# Patient Record
Sex: Male | Born: 1960 | Race: Black or African American | Hispanic: No | Marital: Single | Smoking: Never smoker
Health system: Southern US, Community
[De-identification: ages and names within clinical notes are randomized; demographics above are authoritative.]

## PROBLEM LIST (undated history)

## (undated) DIAGNOSIS — M21372 Foot drop, left foot: Secondary | ICD-10-CM

## (undated) DIAGNOSIS — R269 Unspecified abnormalities of gait and mobility: Secondary | ICD-10-CM

## (undated) DIAGNOSIS — D649 Anemia, unspecified: Secondary | ICD-10-CM

## (undated) DIAGNOSIS — E785 Hyperlipidemia, unspecified: Secondary | ICD-10-CM

## (undated) DIAGNOSIS — I69354 Hemiplegia and hemiparesis following cerebral infarction affecting left non-dominant side: Principal | ICD-10-CM

## (undated) DIAGNOSIS — N400 Enlarged prostate without lower urinary tract symptoms: Secondary | ICD-10-CM

## (undated) DIAGNOSIS — I10 Essential (primary) hypertension: Secondary | ICD-10-CM

## (undated) DIAGNOSIS — R569 Unspecified convulsions: Secondary | ICD-10-CM

## (undated) DIAGNOSIS — F329 Major depressive disorder, single episode, unspecified: Secondary | ICD-10-CM

## (undated) DIAGNOSIS — I639 Cerebral infarction, unspecified: Secondary | ICD-10-CM

## (undated) DIAGNOSIS — F0390 Unspecified dementia without behavioral disturbance: Secondary | ICD-10-CM

## (undated) DIAGNOSIS — K219 Gastro-esophageal reflux disease without esophagitis: Secondary | ICD-10-CM

## (undated) DIAGNOSIS — Z982 Presence of cerebrospinal fluid drainage device: Secondary | ICD-10-CM

## (undated) DIAGNOSIS — N189 Chronic kidney disease, unspecified: Secondary | ICD-10-CM

## (undated) HISTORY — PX: OTHER SURGICAL HISTORY: SHX169

## (undated) HISTORY — PX: CAROTID ENDARTERECTOMY: SUR193

## (undated) HISTORY — DX: Hyperlipidemia, unspecified: E78.5

## (undated) HISTORY — DX: Unspecified convulsions: R56.9

## (undated) HISTORY — DX: Unspecified abnormalities of gait and mobility: R26.9

## (undated) HISTORY — DX: Major depressive disorder, single episode, unspecified: F32.9

## (undated) HISTORY — DX: Anemia, unspecified: D64.9

## (undated) HISTORY — DX: Cerebral infarction, unspecified: I63.9

## (undated) HISTORY — DX: Hemiplegia and hemiparesis following cerebral infarction affecting left non-dominant side: I69.354

## (undated) HISTORY — DX: Presence of cerebrospinal fluid drainage device: Z98.2

## (undated) HISTORY — DX: Chronic kidney disease, unspecified: N18.9

## (undated) HISTORY — DX: Unspecified dementia without behavioral disturbance: F03.90

## (undated) HISTORY — DX: Gastro-esophageal reflux disease without esophagitis: K21.9

## (undated) HISTORY — DX: Foot drop, left foot: M21.372

## (undated) HISTORY — DX: Benign prostatic hyperplasia without lower urinary tract symptoms: N40.0

## (undated) NOTE — *Deleted (*Deleted)
COVID Vaccine Completed:Yes Date COVID Vaccine completed:11/28/19 COVID vaccine manufacturer: Pfizer     PCP - Valere Dross FNP Cardiologist -none   Chest x-ray - no EKG - 07/09/20 Stress Test - no ECHO - no Cardiac Cath - no Pacemaker/ICD device last checked:NA  Sleep Study - no CPAP -   Fasting Blood Sugar - NA Checks Blood Sugar _____ times a day  Blood Thinner Instructions:NA Aspirin Instructions: Last Dose:  Anesthesia review:   Patient denies shortness of breath, fever, cough and chest pain at PAT appointment  yes   Patient verbalized understanding of instructions that were given to them at the PAT appointment. Patient was also instructed that they will need to review over the PAT instructions again at home before surgery. Yes  Pt lives at [facility] assisted living. He uses [facility] and will need a rapid Covid test done DOS. He has a motorized wheelchair because of weakness on Lt side

---

## 2008-09-15 DIAGNOSIS — Z982 Presence of cerebrospinal fluid drainage device: Secondary | ICD-10-CM

## 2008-09-15 DIAGNOSIS — I639 Cerebral infarction, unspecified: Secondary | ICD-10-CM

## 2008-09-15 HISTORY — DX: Presence of cerebrospinal fluid drainage device: Z98.2

## 2008-09-15 HISTORY — DX: Cerebral infarction, unspecified: I63.9

## 2014-04-07 ENCOUNTER — Telehealth: Payer: Self-pay | Admitting: *Deleted

## 2014-04-07 NOTE — Telephone Encounter (Signed)
Appointment scheduled.

## 2014-04-10 ENCOUNTER — Encounter: Payer: Self-pay | Admitting: *Deleted

## 2014-04-10 ENCOUNTER — Encounter: Payer: Self-pay | Admitting: Neurology

## 2014-04-18 ENCOUNTER — Encounter: Payer: Self-pay | Admitting: Neurology

## 2014-04-18 ENCOUNTER — Ambulatory Visit (INDEPENDENT_AMBULATORY_CARE_PROVIDER_SITE_OTHER): Payer: Medicaid Other | Admitting: Neurology

## 2014-04-18 VITALS — BP 76/52 | HR 46 | Ht 71.0 in | Wt 200.0 lb

## 2014-04-18 DIAGNOSIS — I69959 Hemiplegia and hemiparesis following unspecified cerebrovascular disease affecting unspecified side: Secondary | ICD-10-CM

## 2014-04-18 DIAGNOSIS — I69354 Hemiplegia and hemiparesis following cerebral infarction affecting left non-dominant side: Secondary | ICD-10-CM

## 2014-04-18 DIAGNOSIS — R269 Unspecified abnormalities of gait and mobility: Secondary | ICD-10-CM | POA: Insufficient documentation

## 2014-04-18 HISTORY — DX: Unspecified abnormalities of gait and mobility: R26.9

## 2014-04-18 HISTORY — DX: Hemiplegia and hemiparesis following cerebral infarction affecting left non-dominant side: I69.354

## 2014-04-18 MED ORDER — BACLOFEN 10 MG PO TABS: ORAL_TABLET | ORAL | Status: DC

## 2014-04-18 NOTE — Progress Notes (Signed)
Reason for visit: Left hemiparesis  Wesley [name] is an 53 y.o. 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 The patient was last seen through this office on 05/05/2012. At that time, he was been placed on baclofen for his left-sided spasticity. The patient has apparently stopped walking on a regular basis. He was using a hemiwalker for ambulation. He has been reduced on the baclofen, even though he indicates that previously he was tolerating the medication well. He now is on 10 mg twice daily of baclofen. The patient has significant stiffness and weakness of the left side the body. The patient denies any recent falls. He is basically standing for transfers, and using a power wheelchair for mobility. The patient is motivated to try to get back to walking better. The patient returns to this office for an evaluation.  Past Medical History  Diagnosis Date  . Brainstem stroke     right  . Ventricular shunt in place   . Seizures   . GERD (gastroesophageal reflux disease)   . Depression   . Chronic renal insufficiency   . Dyslipidemia   . BPH (benign prostatic hyperplasia)   . Gait disorder   . Hemiparesis affecting left side as late effect of cerebrovascular accident 04/18/2014  . Abnormality of gait 04/18/2014    Past Surgical History  Procedure Laterality Date  . Carotid endarterectomy      Family History  Problem Relation Age of Onset  . Diabetes Mother     died of dm complication  . Brain cancer Father     brain tumor  . Alcoholism Sister     Social history:  reports that he has never smoked. He has never used smokeless tobacco. He reports that he does not drink alcohol or use illicit drugs.   No Known Allergies  Medications:  Current Outpatient Prescriptions on File Prior to Visit  Medication Sig Dispense Refill  . acetaminophen  (TYLENOL) 325 MG tablet Take 650 mg by mouth every 4 (four) hours as needed.      . levETIRAcetam (KEPPRA) 1000 MG tablet Take 1,000 mg by mouth 2 (two) times daily.      . Multiple Vitamin (MULTIVITAMIN) tablet Take 1 tablet by mouth daily.      . simvastatin (ZOCOR) 10 MG tablet Take 10 mg by mouth daily.      . tamsulosin (FLOMAX) 0.4 MG CAPS capsule Take 0.4 mg by mouth at bedtime.       No current facility-administered medications on file prior to visit.    ROS:  Out of a complete 14 system review of symptoms, the patient complains only of the following symptoms, and all other reviewed systems are negative.  Walking difficulty, muscle stiffness  Blood pressure 76/52, pulse 46, height 5\' 11"  (1.803 m), weight 200 lb (90.719 kg).  Physical Exam  General: The patient is alert and cooperative at the time of the examination.  Skin: No significant peripheral edema is noted.   Neurologic Exam  Mental status: The patient is oriented x 3.  Cranial nerves: Facial symmetry is present. Speech is normal, no aphasia or dysarthria is noted. Extraocular movements are full. Visual fields are full.  Motor: The patient has good strength in the right extremities. On the left side, there is increased motor tone on the left arm and left leg. The left arm is  in flexion, with minimal grip, flexion or extension of elbow, or ability to abduct the arm. With the leg on the left, there is increased motor tone, the patient is able to extend the knee, but then cannot bend the knee.  Sensory examination: Soft touch sensation is decreased on the left arm and left leg as compared to the right. Soft touch sensation on the face is symmetric.  Coordination: The patient has good finger-nose-finger and heel-to-shin on the right, unable to perform on the left..  Gait and station: The patient is able to stand up with some assistance. Once up, he demonstrates a prominent circumduction gait with the left leg, can walk  with assistance.  Reflexes: Deep tendon reflexes are slightly increased on the left arm and leg.   Assessment/Plan:  1. Right brain intracranial hemorrhage, left hemiparesis  2. Gait disorder  The patient has significant spasticity on the left side of the body. The patient will be increased on the baclofen taking 10 mg 3 times daily for 2 weeks, then take 20 mg the morning, and 10 mg at midday and evening. He will followup in 3-4 months. We will get physical therapy to start getting him up and try to ambulate him. The patient will need medication adjustments, and if the oral medications are not effective, a baclofen pump may be indicated in the future.  Marlan Palau. Keith Renelle Stegenga MD 04/18/2014 8:38 PM  [facility] [address] [place], [place] [address]  Phone [phone] Fax [phone]

## 2014-04-18 NOTE — Patient Instructions (Signed)

## 2014-04-25 ENCOUNTER — Ambulatory Visit: Payer: Medicaid Other | Attending: Neurology

## 2014-04-25 DIAGNOSIS — R279 Unspecified lack of coordination: Secondary | ICD-10-CM | POA: Insufficient documentation

## 2014-04-25 DIAGNOSIS — M629 Disorder of muscle, unspecified: Secondary | ICD-10-CM | POA: Diagnosis not present

## 2014-04-25 DIAGNOSIS — M2420 Disorder of ligament, unspecified site: Secondary | ICD-10-CM | POA: Insufficient documentation

## 2014-04-25 DIAGNOSIS — IMO0001 Reserved for inherently not codable concepts without codable children: Secondary | ICD-10-CM | POA: Insufficient documentation

## 2014-04-25 DIAGNOSIS — R262 Difficulty in walking, not elsewhere classified: Secondary | ICD-10-CM | POA: Insufficient documentation

## 2014-07-25 ENCOUNTER — Ambulatory Visit: Payer: Medicaid Other | Admitting: Neurology

## 2014-08-18 ENCOUNTER — Ambulatory Visit: Payer: Medicaid Other | Admitting: Adult Health

## 2014-08-21 ENCOUNTER — Encounter: Payer: Self-pay | Admitting: Neurology

## 2014-08-21 ENCOUNTER — Ambulatory Visit (INDEPENDENT_AMBULATORY_CARE_PROVIDER_SITE_OTHER): Payer: Medicaid Other | Admitting: Neurology

## 2014-08-21 VITALS — BP 89/64 | HR 47

## 2014-08-21 DIAGNOSIS — R269 Unspecified abnormalities of gait and mobility: Secondary | ICD-10-CM

## 2014-08-21 DIAGNOSIS — I69354 Hemiplegia and hemiparesis following cerebral infarction affecting left non-dominant side: Secondary | ICD-10-CM

## 2014-08-21 DIAGNOSIS — M21372 Foot drop, left foot: Secondary | ICD-10-CM

## 2014-08-21 DIAGNOSIS — I69854 Hemiplegia and hemiparesis following other cerebrovascular disease affecting left non-dominant side: Secondary | ICD-10-CM

## 2014-08-21 HISTORY — DX: Foot drop, left foot: M21.372

## 2014-08-21 NOTE — Patient Instructions (Addendum)
Baclofen dose of 20 mg in the morning and 10 mg at midday and in the evening  Fall Prevention and Home Safety Falls cause injuries and can affect all age groups. It is possible to use preventive measures to significantly decrease the likelihood of falls. There are many simple measures which can make your home safer and prevent falls. OUTDOORS  Repair cracks and edges of walkways and driveways.  Remove high doorway thresholds.  Trim shrubbery on the main path into your home.  Have good outside lighting.  Clear walkways of tools, rocks, debris, and clutter.  Check that handrails are not broken and are securely fastened. Both sides of steps should have handrails.  Have leaves, snow, and ice cleared regularly.  Use sand or salt on walkways during winter months.  In the garage, clean up grease or oil spills. BATHROOM  Install night lights.  Install grab bars by the toilet and in the tub and shower.  Use non-skid mats or decals in the tub or shower.  Place a plastic non-slip stool in the shower to sit on, if needed.  Keep floors dry and clean up all water on the floor immediately.  Remove soap buildup in the tub or shower on a regular basis.  Secure bath mats with non-slip, double-sided rug tape.  Remove throw rugs and tripping hazards from the floors. BEDROOMS  Install night lights.  Make sure a bedside light is easy to reach.  Do not use oversized bedding.  Keep a telephone by your bedside.  Have a firm chair with side arms to use for getting dressed.  Remove throw rugs and tripping hazards from the floor. KITCHEN  Keep handles on pots and pans turned toward the center of the stove. Use back burners when possible.  Clean up spills quickly and allow time for drying.  Avoid walking on wet floors.  Avoid hot utensils and knives.  Position shelves so they are not too high or low.  Place commonly used objects within easy reach.  If necessary, use a sturdy  step stool with a grab bar when reaching.  Keep electrical cables out of the way.  Do not use floor polish or wax that makes floors slippery. If you must use wax, use non-skid floor wax.  Remove throw rugs and tripping hazards from the floor. STAIRWAYS  Never leave objects on stairs.  Place handrails on both sides of stairways and use them. Fix any loose handrails. Make sure handrails on both sides of the stairways are as long as the stairs.  Check carpeting to make sure it is firmly attached along stairs. Make repairs to worn or loose carpet promptly.  Avoid placing throw rugs at the top or bottom of stairways, or properly secure the rug with carpet tape to prevent slippage. Get rid of throw rugs, if possible.  Have an electrician put in a light switch at the top and bottom of the stairs. OTHER FALL PREVENTION TIPS  Wear low-heel or rubber-soled shoes that are supportive and fit well. Wear closed toe shoes.  When using a stepladder, make sure it is fully opened and both spreaders are firmly locked. Do not climb a closed stepladder.  Add color or contrast paint or tape to grab bars and handrails in your home. Place contrasting color strips on first and last steps.  Learn and use mobility aids as needed. Install an electrical emergency response system.  Turn on lights to avoid dark areas. Replace light bulbs that burn out immediately.  Get light switches that glow.  Arrange furniture to create clear pathways. Keep furniture in the same place.  Firmly attach carpet with non-skid or double-sided tape.  Eliminate uneven floor surfaces.  Select a carpet pattern that does not visually hide the edge of steps.  Be aware of all pets. OTHER HOME SAFETY TIPS  Set the water temperature for 120 F (48.8 C).  Keep emergency numbers on or near the telephone.  Keep smoke detectors on every level of the home and near sleeping areas. Document Released: 08/22/2002 Document Revised:  03/02/2012 Document Reviewed: 11/21/2011 [facility] Patient Information 2015 [place]. This information is not intended to replace advice given to you by your health care provider. Make sure you discuss any questions you have with your health care provider.

## 2014-08-21 NOTE — Progress Notes (Signed)
Reason for visit: Hemiparesis  Wesley RollingDavid L [name] is an 53 y.o. male  History of present illness:  Wesley [name] is a [age] right-handed black male with a history of cerebrovascular disease sustaining an intracranial hemorrhage on the right brain with a left hemiparesis. The patient has arm greater than left leg weakness. The patient has had spasticity of the left side, and his baclofen dose was increased when he was last seen. The patient is not sure that he is getting the baclofen 3 times daily, he believes that he is getting it twice a day. The patient has not been walking much with a hemiwalker, but he has a significant left sided footdrop as well. The patient does fall on occasion, but not frequently. He returns to this office for further evaluation. He indicates that he is tolerating the medications fairly well.  Past Medical History  Diagnosis Date  . Brainstem stroke     right  . Ventricular shunt in place   . Seizures   . GERD (gastroesophageal reflux disease)   . Depression   . Chronic renal insufficiency   . Dyslipidemia   . BPH (benign prostatic hyperplasia)   . Gait disorder   . Hemiparesis affecting left side as late effect of cerebrovascular accident 04/18/2014  . Abnormality of gait 04/18/2014  . Foot drop, left 08/21/2014    Past Surgical History  Procedure Laterality Date  . Carotid endarterectomy    . Umilical hernia repair    . Athroscopic surgery knee Left     Family History  Problem Relation Age of Onset  . Diabetes Mother     died of dm complication  . Brain cancer Father     brain tumor  . Alcoholism Sister     Social history:  reports that he has never smoked. He has never used smokeless tobacco. He reports that he does not drink alcohol or use illicit drugs.   No Known Allergies  Medications:  Current Outpatient Prescriptions on File Prior to Visit  Medication Sig Dispense Refill  . acetaminophen (TYLENOL) 325 MG tablet Take 650 mg by mouth every 4  (four) hours as needed.    Marland Kitchen. amLODipine (NORVASC) 10 MG tablet Take 10 mg by mouth daily.    . baclofen (LIORESAL) 10 MG tablet One tablet three times a day for 2 weeks, then take 2 tablets in the morning and one tablet at midday and evening 120 each 3  . Cholecalciferol (VITAMIN D-3) 5000 UNITS TABS Take by mouth daily.    Marland Kitchen. guaifenesin (ROBITUSSIN) 100 MG/5ML syrup Take 200 mg by mouth 3 (three) times daily as needed for cough.    . levETIRAcetam (KEPPRA) 1000 MG tablet Take 1,000 mg by mouth 2 (two) times daily.    Marland Kitchen. loperamide (IMODIUM) 2 MG capsule Take by mouth as needed for diarrhea or loose stools.    . magnesium hydroxide (MILK OF MAGNESIA) 400 MG/5ML suspension Take by mouth daily as needed for mild constipation.    . metoprolol succinate (TOPROL-XL) 50 MG 24 hr tablet Take 50 mg by mouth 2 (two) times daily. Take with or immediately following a meal. Take one and one half tablets two times a day.    . Multiple Vitamin (MULTIVITAMIN) tablet Take 1 tablet by mouth daily.    Marland Kitchen. neomycin-bacitracin-polymyxin (NEOSPORIN) [phone] ointment Apply topically as needed.    . Polyethylene Glycol 3350 GRAN by Does not apply route.    . selenium sulfide (ANTI-DANDRUFF) 1 % LOTN  Apply 1 application topically daily.    . simvastatin (ZOCOR) 10 MG tablet Take 10 mg by mouth daily.    . tamsulosin (FLOMAX) 0.4 MG CAPS capsule Take 0.4 mg by mouth. Take two tabs in morning.     No current facility-administered medications on file prior to visit.    ROS:  Out of a complete 14 system review of symptoms, the patient complains only of the following symptoms, and all other reviewed systems are negative.  Left-sided weakness Gait disturbance  Blood pressure 89/64, pulse 47, height 0' (0 m), weight 0 lb (0 kg).  Physical Exam  General: The patient is alert and cooperative at the time of the examination.  Skin: No significant peripheral edema is noted.   Neurologic Exam  Mental status: The  patient is oriented x 3.  Cranial nerves: Facial symmetry is not present. The left nasolabial fold is depressed. Speech is normal, no aphasia or dysarthria is noted. Extraocular movements are full. Visual fields are full.  Motor: The patient has good strength in all 4 extremities.  Sensory examination: Soft touch sensation is symmetric on the face, arms, but decreased on the left face  Coordination: The patient has good finger-nose-finger and heel-to-shin on the right, the patient is able to perform heel-to-shin on the left leg with some difficulty.  Gait and station: The gait was not tested. The patient is wheelchair-bound.  Reflexes: Deep tendon reflexes are symmetric.   Assessment/Plan:  1. Right brain intracranial hemorrhage, left hemiparesis  2. Gait disorder  The patient will remain on the baclofen at the current dose taking 20 mg the morning, 10 mg at noon and in the evening. The patient indicates that he does not wish to consider a baclofen pump in the future. He will be given up her prescription for AFO brace for the left foot, and he will be set up for physical therapy evaluation. He will follow-up in 6 months.   Marlan Palau. Keith Willis MD 08/21/2014 7:59 PM  [facility] [address] [place], [place] [address]  Phone [phone] Fax [phone]

## 2015-02-20 ENCOUNTER — Ambulatory Visit (INDEPENDENT_AMBULATORY_CARE_PROVIDER_SITE_OTHER): Payer: Medicaid Other | Admitting: Neurology

## 2015-02-20 ENCOUNTER — Encounter: Payer: Self-pay | Admitting: Neurology

## 2015-02-20 VITALS — BP 87/66 | HR 50 | Ht 71.0 in | Wt 224.0 lb

## 2015-02-20 DIAGNOSIS — M21372 Foot drop, left foot: Secondary | ICD-10-CM

## 2015-02-20 DIAGNOSIS — R269 Unspecified abnormalities of gait and mobility: Secondary | ICD-10-CM | POA: Diagnosis not present

## 2015-02-20 DIAGNOSIS — I69354 Hemiplegia and hemiparesis following cerebral infarction affecting left non-dominant side: Secondary | ICD-10-CM

## 2015-02-20 DIAGNOSIS — I69854 Hemiplegia and hemiparesis following other cerebrovascular disease affecting left non-dominant side: Secondary | ICD-10-CM

## 2015-02-20 MED ORDER — TIZANIDINE HCL 2 MG PO TABS: 2.0000 mg | ORAL_TABLET | Freq: Four times a day (QID) | ORAL | Status: DC | PRN

## 2015-02-20 MED ORDER — BACLOFEN 20 MG PO TABS: 20.0000 mg | ORAL_TABLET | Freq: Three times a day (TID) | ORAL | Status: AC

## 2015-02-20 NOTE — Patient Instructions (Signed)
Spasticity Spasticity is a condition in which certain muscles contract continuously. This causes stiffness or tightness of the muscles. It may interfere with movement, speech, and manner of walking. CAUSES  This condition is usually caused by damage to the portion of the brain or spinal cord that controls voluntary movement. It may occur in association with:  Spinal cord injury.  Multiple sclerosis.  Cerebral palsy.  Brain damage due to lack of oxygen.  Brain trauma.  Severe head injury.  Metabolic diseases such as:  Adrenoleukodystrophy.  ALS (Lou Gehrig's disease).  Phenylketonuria. SYMPTOMS   Increased muscle tone (hypertonicity).  A series of rapid muscle contractions (clonus).  Exaggerated deep tendon reflexes.  Muscle spasms.  Involuntary crossing of the legs (scissoring).  Fixed joints. The degree of spasticity varies. It ranges from mild muscle stiffness to severe, painful, and uncontrollable muscle spasms. It can interfere with rehabilitation in patients with certain disorders. It often interferes with daily activities. TREATMENT  Treatment may include:  Medications.  Physical therapy regimens. They may include muscle stretching and range of motion exercises. These help prevent shrinkage or shortening of muscles. They also help reduce the severity of symptoms.  Surgery. This may be recommended for tendon release or to sever the nerve-muscle pathway. PROGNOSIS  The outcome for those with spasticity depends on:  Severity of the spasticity.  Associated disorder(s). Document Released: 08/22/2002 Document Revised: 11/24/2011 Document Reviewed: 11/15/2013 Exit[employer] Patient Information 2015 [employer], LLC. This information is not intended to replace advice given to you by your health care provider. Make sure you discuss any questions you have with your health care provider.  

## 2015-02-20 NOTE — Progress Notes (Signed)
Reason for visit: Left hemiparesis  Wesley [name] is an 54 y.o. male  History of present illness:  Mr. Sahagian is a 54 year old right-handed black male with a history of a prior right brain intracranial hemorrhage and left sided hemiparesis that has been persistent. The patient is essentially not ambulatory. The patient will walk short distances with a hemiwalker. He denies any falls. He still has a lot of spasticity involving the left leg. He is on baclofen taking 20 mg the morning and 10 mg twice during the day. The patient denies any painful spasms of the arm or the left leg. The patient denies any other new medical issues that have come up since last seen. He returns for further evaluation.  Past Medical History  Diagnosis Date  . Brainstem stroke     right  . Ventricular shunt in place   . Seizures   . GERD (gastroesophageal reflux disease)   . Depression   . Chronic renal insufficiency   . Dyslipidemia   . BPH (benign prostatic hyperplasia)   . Gait disorder   . Hemiparesis affecting left side as late effect of cerebrovascular accident 04/18/2014  . Abnormality of gait 04/18/2014  . Foot drop, left 08/21/2014    Past Surgical History  Procedure Laterality Date  . Carotid endarterectomy    . Umilical hernia repair    . Athroscopic surgery knee Left     Family History  Problem Relation Age of Onset  . Diabetes Mother     died of dm complication  . Brain cancer Father     brain tumor  . Alcoholism Sister     Social history:  reports that he has never smoked. He has never used smokeless tobacco. He reports that he does not drink alcohol or use illicit drugs.   No Known Allergies  Medications:  Prior to Admission medications   Medication Sig Start Date End Date Taking? Authorizing Provider  acetaminophen (TYLENOL) 325 MG tablet Take 650 mg by mouth every 4 (four) hours as needed.   Yes Historical Provider, MD  AFLURIA PRESERVATIVE FREE 0.5 ML SUSY  07/07/14  Yes  Historical Provider, MD  amLODipine (NORVASC) 10 MG tablet Take 10 mg by mouth daily.   Yes Historical Provider, MD  baclofen (LIORESAL) 10 MG tablet One tablet three times a day for 2 weeks, then take 2 tablets in the morning and one tablet at midday and evening 04/18/14  Yes York Spaniel, MD  Cholecalciferol (VITAMIN D-3) 5000 UNITS TABS Take by mouth daily.   Yes Historical Provider, MD  guaifenesin (ROBITUSSIN) 100 MG/5ML syrup Take 200 mg by mouth 3 (three) times daily as needed for cough.   Yes Historical Provider, MD  levETIRAcetam (KEPPRA) 1000 MG tablet Take 1,000 mg by mouth 2 (two) times daily.   Yes Historical Provider, MD  loperamide (IMODIUM) 2 MG capsule Take by mouth as needed for diarrhea or loose stools.   Yes Historical Provider, MD  magnesium hydroxide (MILK OF MAGNESIA) 400 MG/5ML suspension Take by mouth daily as needed for mild constipation.   Yes Historical Provider, MD  metoprolol succinate (TOPROL-XL) 50 MG 24 hr tablet Take 50 mg by mouth 2 (two) times daily. Take with or immediately following a meal. Take one and one half tablets two times a day.   Yes Historical Provider, MD  Multiple Vitamin (MULTIVITAMIN) tablet Take 1 tablet by mouth daily.   Yes Historical Provider, MD  neomycin-bacitracin-polymyxin (NEOSPORIN) [phone] ointment Apply topically  as needed.   Yes Historical Provider, MD  Polyethylene Glycol 3350 GRAN by Does not apply route.   Yes Historical Provider, MD  selenium sulfide (ANTI-DANDRUFF) 1 % LOTN Apply 1 application topically daily.   Yes Historical Provider, MD  simvastatin (ZOCOR) 10 MG tablet Take 10 mg by mouth daily.   Yes Historical Provider, MD  tamsulosin (FLOMAX) 0.4 MG CAPS capsule Take 0.4 mg by mouth. Take two tabs in morning.   Yes Historical Provider, MD  temazepam (RESTORIL) 30 MG capsule Take 30 mg by mouth at bedtime as needed for sleep.   Yes Historical Provider, MD  traZODone (DESYREL) 100 MG tablet Take 100 mg by mouth at  bedtime.   Yes Historical Provider, MD    ROS:  Out of a complete 14 system review of symptoms, the patient complains only of the following symptoms, and all other reviewed systems are negative.  Gait disorder  Blood pressure 87/66, pulse 50, height 5\' 11"  (1.803 m), weight 224 lb (101.606 kg).  Physical Exam  General: The patient is alert and cooperative at the time of the examination.  Skin: No significant peripheral edema is noted.   Neurologic Exam  Mental status: The patient is alert and oriented x 3 at the time of the examination. The patient has apparent normal recent and remote memory, with an apparently normal attention span and concentration ability.   Cranial nerves: Facial symmetry is not present. The patient has some depression of the left nasolabial fold. Speech is normal, no aphasia or dysarthria is noted. Extraocular movements are full. Visual fields are full.  Motor: The patient has good strength in the right extremities. The left upper extremity is near plegic, patient has some grip with the left hand. The left leg has 3/5 strength, significant increase in motor tone noted.  Sensory examination: Soft touch sensation is symmetric on the face, arms, and legs.  Coordination: The patient has good finger-nose-finger and heel-to-shin on the right, not able to perform on the left.  Gait and station: The patient is wheelchair-bound, not ambulated.  Reflexes: Deep tendon reflexes are symmetric.   Assessment/Plan:  1. Left hemiparesis  2. Gait disorder  The patient has significant spasticity involving the left leg. The patient will go up on the baclofen taking 20 mg 3 times daily. The patient will be given tizanidine to take 2 mg if he is going to be making transfers, or traveling. This is a time when the spasticity significantly worsens. He will follow-up in 6 months.  Marlan Palau. Keith Willis MD 02/20/2015 7:44 PM  [facility] [address] [place], [place] [address]  Phone [phone] Fax [phone]

## 2015-08-21 ENCOUNTER — Ambulatory Visit (INDEPENDENT_AMBULATORY_CARE_PROVIDER_SITE_OTHER): Payer: Medicaid Other | Admitting: Neurology

## 2015-08-21 ENCOUNTER — Encounter: Payer: Self-pay | Admitting: Neurology

## 2015-08-21 VITALS — BP 114/74 | HR 58 | Resp 16 | Ht 71.0 in | Wt 224.0 lb

## 2015-08-21 DIAGNOSIS — I69354 Hemiplegia and hemiparesis following cerebral infarction affecting left non-dominant side: Secondary | ICD-10-CM | POA: Diagnosis not present

## 2015-08-21 DIAGNOSIS — M21372 Foot drop, left foot: Secondary | ICD-10-CM | POA: Diagnosis not present

## 2015-08-21 DIAGNOSIS — R269 Unspecified abnormalities of gait and mobility: Secondary | ICD-10-CM | POA: Diagnosis not present

## 2015-08-21 NOTE — Progress Notes (Signed)
Reason for visit: Left hemiparesis  Wesley [name] is an 54 y.o. 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 The patient is minimally ambulatory, he can walk short distances with a hemiwalker, but he indicates that in reality he is not doing much walking. He has had some issues with spasticity of the left side, the baclofen dose was increased taking 20 mg 3 times daily on his last visit. He indicates that he is no longer having painful spasms of the left arm and left leg. He was placed on tizanidine to take if needed, but he has not been on the medication. He has gone back to school trying to get his GED. Overall, he believes that he is doing relatively well. He does not sleep well at night often times, but he is not very active during the day. He will nap some during the day. He comes to this office for an evaluation. He denies any falls, no other new medical issues have come up since last seen.  Past Medical History  Diagnosis Date  . Brainstem stroke (HCC)     right  . Ventricular shunt in place   . Seizures (HCC)   . GERD (gastroesophageal reflux disease)   . Depression   . Chronic renal insufficiency   . Dyslipidemia   . BPH (benign prostatic hyperplasia)   . Gait disorder   . 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 Abnormality of gait 04/18/2014  . Foot drop, left 08/21/2014    Past Surgical History  Procedure Laterality Date  . Carotid endarterectomy    . Umilical hernia repair    . Athroscopic surgery knee Left     Family History  Problem Relation Age of Onset  . Diabetes Mother     died of dm complication  . Brain cancer Father     brain tumor  . Alcoholism Sister     Social history:  reports that he has never smoked. He has never used smokeless tobacco. He reports that he does  not drink alcohol or use illicit drugs.   No Known Allergies  Medications:  Prior to Admission medications   Medication Sig Start Date End Date Taking? Authorizing Provider  AFLURIA PRESERVATIVE FREE 0.5 ML SUSY  07/07/14  Yes Historical Provider, MD  amLODipine (NORVASC) 10 MG tablet Take 10 mg by mouth daily.   Yes Historical Provider, MD  baclofen (LIORESAL) 20 MG tablet Take 1 tablet (20 mg total) by mouth 3 (three) times daily. 02/20/15  Yes York Spanielharles K Bransen [place], MD  Cholecalciferol (VITAMIN D-3) 5000 UNITS TABS Take by mouth daily.   Yes Historical Provider, MD  levETIRAcetam (KEPPRA) 1000 MG tablet Take 1,000 mg by mouth 2 (two) times daily.   Yes Historical Provider, MD  loperamide (IMODIUM) 2 MG capsule Take by mouth as needed for diarrhea or loose stools.   Yes Historical Provider, MD  magnesium hydroxide (MILK OF MAGNESIA) 400 MG/5ML suspension Take by mouth daily as needed for mild constipation.   Yes Historical Provider, MD  metoprolol succinate (TOPROL-XL) 50 MG 24 hr tablet Take 50 mg by mouth 2 (two) times daily. Take with or immediately following a meal. Take one and one half tablets two times a day.   Yes Historical Provider, MD  Multiple Vitamin (MULTIVITAMIN) tablet Take 1 tablet by mouth  daily.   Yes Historical Provider, MD  simvastatin (ZOCOR) 10 MG tablet Take 10 mg by mouth daily.   Yes Historical Provider, MD  tamsulosin (FLOMAX) 0.4 MG CAPS capsule Take 0.4 mg by mouth. Take two tabs in morning.   Yes Historical Provider, MD  temazepam (RESTORIL) 30 MG capsule Take 30 mg by mouth at bedtime as needed for sleep.   Yes Historical Provider, MD  traZODone (DESYREL) 100 MG tablet Take 100 mg by mouth at bedtime.   Yes Historical Provider, MD  acetaminophen (TYLENOL) 325 MG tablet Take 650 mg by mouth every 4 (four) hours as needed.    Historical Provider, MD  guaifenesin (ROBITUSSIN) 100 MG/5ML syrup Take 200 mg by mouth 3 (three) times daily as needed for cough.    Historical  Provider, MD  neomycin-bacitracin-polymyxin (NEOSPORIN) [phone] ointment Apply topically as needed.    Historical Provider, MD  Polyethylene Glycol 3350 GRAN by Does not apply route.    Historical Provider, MD  selenium sulfide (ANTI-DANDRUFF) 1 % LOTN Apply 1 application topically daily.    Historical Provider, MD  tiZANidine (ZANAFLEX) 2 MG tablet Take 1 tablet (2 mg total) by mouth every 6 (six) hours as needed for muscle spasms. Patient not taking: Reported on 08/21/2015 02/20/15   York Spaniel, MD    ROS:  Out of a complete 14 system review of symptoms, the patient complains only of the following symptoms, and all other reviewed systems are negative.  Gait disorder Left-sided weakness  Blood pressure 114/74, pulse 58, resp. rate 16, height  (1.803 m), weight 224 lb (101.606 kg).  Physical Exam  General: The patient is alert and cooperative at the time of the examination.  Skin: No significant peripheral edema is noted.   Neurologic Exam  Mental status: The patient is alert and oriented x 3 at the time of the examination. The patient has apparent normal recent and remote memory, with an apparently normal attention span and concentration ability.   Cranial nerves: Facial symmetry is normal present. There is some depression of the left nasolabial fold. Speech is normal, no aphasia or dysarthria is noted. Extraocular movements are full. Visual fields are full. Affect is slightly flat.   Motor: The patient has good strength in the right  extremities.On the left side, there is flexion of the left arm, 3/5 strength, 4 minus/5 biceps strength, 3/5 triceps strength, 1/5 deltoid strength. Patient has 3/5 strength of the left leg. Patient has difficulty with extension of the fingers of the left hand.  Sensory examination: Soft touch sensation is symmetric on the face, arms, and legs.  Coordination: The patient has good finger-nose-finger and heel-to-shin on the right, unable to  perform on the left.  Gait and station: The patient could not be ambulated, wheelchair-bound.  Reflexes: Deep tendon reflexes are symmetric.   Assessment/Plan:  1. Left hemiparesis, prior right brain intracranial hemorrhage   2. Gait disorder   The patient is doing somewhat better with the spasticity of the left side on the baclofen, we will continue the medication for now. He will follow-up in about 8 months, sooner if needed.   Marlan Palau MD 08/21/2015 7:21 PM  [facility] [address] [place], [place] [address]  Phone [phone] Fax [phone]

## 2015-08-21 NOTE — Patient Instructions (Signed)
Fall Prevention in the Home  Falls can cause injuries and can affect people from all age groups. There are many simple things that you can do to make your home safe and to help prevent falls. WHAT CAN I DO ON THE OUTSIDE OF MY HOME?  Regularly repair the edges of walkways and driveways and fix any cracks.  Remove high doorway thresholds.  Trim any shrubbery on the main path into your home.  Use bright outdoor lighting.  Clear walkways of debris and clutter, including tools and rocks.  Regularly check that handrails are securely fastened and in good repair. Both sides of any steps should have handrails.  Install guardrails along the edges of any raised decks or porches.  Have leaves, snow, and ice cleared regularly.  Use sand or salt on walkways during winter months.  In the garage, clean up any spills right away, including grease or oil spills. WHAT CAN I DO IN THE BATHROOM?  Use night lights.  Install grab bars by the toilet and in the tub and shower. Do not use towel bars as grab bars.  Use non-skid mats or decals on the floor of the tub or shower.  If you need to sit down while you are in the shower, use a plastic, non-slip stool..  Keep the floor dry. Immediately clean up any water that spills on the floor.  Remove soap buildup in the tub or shower on a regular basis.  Attach bath mats securely with double-sided non-slip rug tape.  Remove throw rugs and other tripping hazards from the floor. WHAT CAN I DO IN THE BEDROOM?  Use night lights.  Make sure that a bedside light is easy to reach.  Do not use oversized bedding that drapes onto the floor.  Have a firm chair that has side arms to use for getting dressed.  Remove throw rugs and other tripping hazards from the floor. WHAT CAN I DO IN THE KITCHEN?   Clean up any spills right away.  Avoid walking on wet floors.  Place frequently used items in easy-to-reach places.  If you need to reach for something  above you, use a sturdy step stool that has a grab bar.  Keep electrical cables out of the way.  Do not use floor polish or wax that makes floors slippery. If you have to use wax, make sure that it is non-skid floor wax.  Remove throw rugs and other tripping hazards from the floor. WHAT CAN I DO IN THE STAIRWAYS?  Do not leave any items on the stairs.  Make sure that there are handrails on both sides of the stairs. Fix handrails that are broken or loose. Make sure that handrails are as long as the stairways.  Check any carpeting to make sure that it is firmly attached to the stairs. Fix any carpet that is loose or worn.  Avoid having throw rugs at the top or bottom of stairways, or secure the rugs with carpet tape to prevent them from moving.  Make sure that you have a light switch at the top of the stairs and the bottom of the stairs. If you do not have them, have them installed. WHAT ARE SOME OTHER FALL PREVENTION TIPS?  Wear closed-toe shoes that fit well and support your feet. Wear shoes that have rubber soles or low heels.  When you use a stepladder, make sure that it is completely opened and that the sides are firmly locked. Have someone hold the ladder while you   are using it. Do not climb a closed stepladder.  Add color or contrast paint or tape to grab bars and handrails in your home. Place contrasting color strips on the first and last steps.  Use mobility aids as needed, such as canes, walkers, scooters, and crutches.  Turn on lights if it is dark. Replace any light bulbs that burn out.  Set up furniture so that there are clear paths. Keep the furniture in the same spot.  Fix any uneven floor surfaces.  Choose a carpet design that does not hide the edge of steps of a stairway.  Be aware of any and all pets.  Review your medicines with your healthcare provider. Some medicines can cause dizziness or changes in blood pressure, which increase your risk of falling. Talk  with your health care provider about other ways that you can decrease your risk of falls. This may include working with a physical therapist or trainer to improve your strength, balance, and endurance.   This information is not intended to replace advice given to you by your health care provider. Make sure you discuss any questions you have with your health care provider.   Document Released: 08/22/2002 Document Revised: 01/16/2015 Document Reviewed: 10/06/2014 Elsevier Interactive Patient Education 2016 [employer] Inc.  

## 2016-04-21 ENCOUNTER — Ambulatory Visit: Payer: Medicaid Other | Admitting: Adult Health

## 2016-04-22 ENCOUNTER — Encounter: Payer: Self-pay | Admitting: Adult Health

## 2016-05-01 ENCOUNTER — Ambulatory Visit (INDEPENDENT_AMBULATORY_CARE_PROVIDER_SITE_OTHER): Payer: Medicaid Other | Admitting: Adult Health

## 2016-05-01 ENCOUNTER — Encounter: Payer: Self-pay | Admitting: Adult Health

## 2016-05-01 VITALS — BP 102/67 | HR 54 | Ht 71.0 in

## 2016-05-01 DIAGNOSIS — Z8673 Personal history of transient ischemic attack (TIA), and cerebral infarction without residual deficits: Secondary | ICD-10-CM

## 2016-05-01 NOTE — Patient Instructions (Signed)
Continue Baclofen Continue monitor blood pressure goal <130/90 Cholesterol LDL <100 If your symptoms worsen or you develop new symptoms please let us know.

## 2016-05-01 NOTE — Progress Notes (Signed)
I have read the note, and I agree with the clinical assessment and plan.  Zoanne Newill KEITH   

## 2016-05-01 NOTE — Progress Notes (Signed)
PATIENT: Wesley Wesley [name] DOB: [birth year]  REASON FOR VISIT: follow up- stroke HISTORY FROM: patient  HISTORY OF PRESENT ILLNESS: Wesley Wesley [name] is a 55 year old Wesley [name] with a history of right brain intracranial hemorrhage and left hemi-paresis. He returns today for follow-up. He reports that the baclofen is still beneficial for his spasticity. She is currently taking 20 mg 3 times a day. He primarily uses a motorized wheelchair for ambulation. He states that he can stand and walk to the shower with a walker. [facility]. He states that he has someone help him with dressing. He eats all his meals there. Blood pressure has been in normal range. Reports his primary care is managing his cholesterol. Denies any new neurological symptoms. Reports that he heard of a study at [employer] that may potentially help stroke victims again. He is considering contacting them to see if he qualifies for the study. He returns today for an evaluation.  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 The patient is minimally ambulatory, he can walk short distances with a hemiwalker, but he indicates that in reality he is not doing much walking. He has had some issues with spasticity of the left side, the baclofen dose was increased taking 20 mg 3 times daily on his last visit. He indicates that he is no longer having painful spasms of the left arm and left leg. He was placed on tizanidine to take if needed, but he has not been on the medication. He has gone back to school trying to get his GED. Overall, he believes that he is doing relatively well. He does not sleep well at night often times, but he is not very active during the day. He will nap some during the day. He comes to this office for an evaluation. He denies any falls, no other new medical issues have come up since last seen.  REVIEW OF  SYSTEMS: Out of a complete 14 system review of symptoms, the patient complains only of the following symptoms, and all other reviewed systems are negative.  See history of present illness  ALLERGIES: No Known Allergies  HOME MEDICATIONS: Outpatient Medications Prior to Visit  Medication Sig Dispense Refill  . acetaminophen (TYLENOL) 325 MG tablet Take 650 mg by mouth every 4 (four) hours as needed.    Marland Kitchen. AFLURIA PRESERVATIVE FREE 0.5 ML SUSY   0  . amLODipine (NORVASC) 10 MG tablet Take 10 mg by mouth daily.    . baclofen (LIORESAL) 20 MG tablet Take 1 tablet (20 mg total) by mouth 3 (three) times daily. 90 each 5  . Cholecalciferol (VITAMIN D-3) 5000 UNITS TABS Take by mouth daily.    Marland Kitchen. guaifenesin (ROBITUSSIN) 100 MG/5ML syrup Take 200 mg by mouth 3 (three) times daily as needed for cough.    . levETIRAcetam (KEPPRA) 1000 MG tablet Take 1,000 mg by mouth 2 (two) times daily.    Marland Kitchen. loperamide (IMODIUM) 2 MG capsule Take by mouth as needed for diarrhea or loose stools.    . magnesium hydroxide (MILK OF MAGNESIA) 400 MG/5ML suspension Take by mouth daily as needed for mild constipation.    . metoprolol succinate (TOPROL-XL) 50 MG 24 hr tablet Take 50 mg by mouth 2 (two) times daily. Take with or immediately following a meal. Take one and one half tablets two times a day.    . Multiple Vitamin (  MULTIVITAMIN) tablet Take 1 tablet by mouth daily.    Marland Kitchen. neomycin-bacitracin-polymyxin (NEOSPORIN) [phone] ointment Apply topically as needed.    . Polyethylene Glycol 3350 GRAN by Does not apply route.    . selenium sulfide (ANTI-DANDRUFF) 1 % LOTN Apply 1 application topically daily.    . simvastatin (ZOCOR) 10 MG tablet Take 10 mg by mouth daily.    . tamsulosin (FLOMAX) 0.4 MG CAPS capsule Take 0.4 mg by mouth. Take two tabs in morning.    . temazepam (RESTORIL) 30 MG capsule Take 30 mg by mouth at bedtime as needed for sleep.    Marland Kitchen. tiZANidine (ZANAFLEX) 2 MG tablet Take 1 tablet (2 mg total) by  mouth every 6 (six) hours as needed for muscle spasms. 60 tablet 3  . traZODone (DESYREL) 100 MG tablet Take 100 mg by mouth at bedtime.     No facility-administered medications prior to visit.     PAST MEDICAL HISTORY: Past Medical History:  Diagnosis Date  . Abnormality of gait 04/18/2014  . BPH (benign prostatic hyperplasia)   . Brainstem stroke (HCC)    right  . Chronic renal insufficiency   . Depression   . Dyslipidemia   . Foot drop, left 08/21/2014  . Gait disorder   . GERD (gastroesophageal reflux disease)   . 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 Seizures (HCC)   . Ventricular shunt in place     PAST SURGICAL HISTORY: Past Surgical History:  Procedure Laterality Date  . athroscopic surgery knee Left   . CAROTID ENDARTERECTOMY    . umilical hernia repair      FAMILY HISTORY: Family History  Problem Relation Age of Onset  . Diabetes Mother     died of dm complication  . Brain cancer Father     brain tumor  . Alcoholism Sister     SOCIAL HISTORY: Social History   Social History  . Marital status: Single    Spouse name: N/A  . Number of children: 2  . Years of education: 9th   Occupational History  . disability    Social History Main Topics  . Smoking status: Never Smoker  . Smokeless tobacco: Never Used  . Alcohol use No  . Drug use: No  . Sexual activity: Not on file   Other Topics Concern  . Not on file   Social History Narrative   Patient drinks about 2 cups of coffee daily.   Patient is right handed.   [facility] [phone]      PHYSICAL EXAM  Vitals:   05/01/16 0907  BP: 102/67  Pulse: (!) 54  Height: 5\' 11"  (1.803 m)   There is no height or weight on file to calculate BMI.  Generalized: Well developed, in no acute distress   Neurological examination  Mentation: Alert oriented to time, place, history taking. Follows all commands speech and language  fluent Cranial nerve II-XII: Pupils were equal round reactive to light. Extraocular movements were full, visual field were full on confrontational test. Facial sensation and strength were normal. Uvula tongue midline. Head turning and shoulder shrug  were normal and symmetric. Motor: The motor testing reveals 5 over 5 strength On the right side. Minimal movement of the left arm. 2-3/5 strength in the left lower extremity. Sensory: Sensory testing is intact to soft touch on all 4 extremities. No evidence of extinction is noted.  Coordination: Cerebellar testing reveals good finger-nose-finger and heel-to-shin  bilaterally.  Gait and station: Patient is in a motorized wheelchair Reflexes: Deep tendon reflexes are symmetric and normal bilaterally.   DIAGNOSTIC DATA (LABS, IMAGING, TESTING) - I reviewed patient records, labs, notes, testing and imaging myself where available.     ASSESSMENT AND PLAN 55 y.o. year old Wesley [name]  has a past medical history of Abnormality of gait (04/18/2014); BPH (benign prostatic hyperplasia); Brainstem stroke (HCC); Chronic renal insufficiency; Depression; Dyslipidemia; Foot drop, left (08/21/2014); Gait disorder; GERD (gastroesophageal reflux disease); Hemiparesis affecting left side as late effect of cerebrovascular accident (HCC) (04/18/2014); Seizures (HCC); and Ventricular shunt in place. here with:  1. Stroke  Overall the patient is doing well. He will continue on baclofen 20 mg 3 times a day for spasticity. He is encouraged to continue to monitor his blood pressure and cholesterol. Advised that if he has any new neurological symptoms he should let us know. He will follow-up in one year or sooner if needed.  Butch Penny, MSN, NP-C 05/01/2016, 9:10 AM [facility] [address], [address] [place], [place] [address] [phone]

## 2016-10-01 ENCOUNTER — Emergency Department (HOSPITAL_COMMUNITY)
Admission: EM | Admit: 2016-10-01 | Discharge: 2016-10-01 | Disposition: A | Discharge status: Home / Self Care | Payer: Medicaid Other | Attending: Emergency Medicine | Admitting: Emergency Medicine

## 2016-10-01 ENCOUNTER — Encounter (HOSPITAL_COMMUNITY): Payer: Self-pay

## 2016-10-01 DIAGNOSIS — Z79899 Other long term (current) drug therapy: Secondary | ICD-10-CM | POA: Insufficient documentation

## 2016-10-01 DIAGNOSIS — M545 Low back pain, unspecified: Secondary | ICD-10-CM

## 2016-10-01 DIAGNOSIS — N189 Chronic kidney disease, unspecified: Secondary | ICD-10-CM | POA: Diagnosis not present

## 2016-10-01 DIAGNOSIS — R109 Unspecified abdominal pain: Secondary | ICD-10-CM | POA: Diagnosis present

## 2016-10-01 LAB — URINALYSIS, ROUTINE W REFLEX MICROSCOPIC
BILIRUBIN URINE: NEGATIVE
GLUCOSE, UA: NEGATIVE mg/dL
Hgb urine dipstick: NEGATIVE
Ketones, ur: NEGATIVE mg/dL
LEUKOCYTES UA: NEGATIVE
Nitrite: NEGATIVE
PH: 5 (ref 5.0–8.0)
Protein, ur: NEGATIVE mg/dL
Specific Gravity, Urine: 1.021 (ref 1.005–1.030)

## 2016-10-01 MED ORDER — OXYCODONE-ACETAMINOPHEN 5-325 MG PO TABS
1.0000 | ORAL_TABLET | Freq: Once | ORAL | Status: AC
  Administered 2016-10-01: 1 via ORAL
  Filled 2016-10-01: qty 1

## 2016-10-01 MED ORDER — OXYCODONE-ACETAMINOPHEN 5-325 MG PO TABS: 1.0000 | ORAL_TABLET | ORAL | 0 refills | Status: DC | PRN

## 2016-10-01 MED ORDER — METHOCARBAMOL 500 MG PO TABS: 500.0000 mg | ORAL_TABLET | Freq: Two times a day (BID) | ORAL | 0 refills | Status: DC

## 2016-10-01 NOTE — ED Triage Notes (Signed)
Right flank tenderness x 2 days. No injury, no hx of kidney stones. Right flank is tender to palpation. Normal UO. No pain with urination

## 2016-10-01 NOTE — ED Notes (Signed)
[facility] called  

## 2016-10-01 NOTE — ED Notes (Signed)
Pt in gown with urinal and aware of need for urine specimen

## 2016-10-01 NOTE — ED Provider Notes (Signed)
[facility]-EMERGENCY DEPT Provider Note   CSN: [id number] Arrival date & time: 10/01/16  0156     History   Chief Complaint No chief complaint on file.   HPI Wesley [name] is a 56 y.o. male.  Patient presents with complaint in right "kidney area" x 2 days. No fever, nausea, urinary symptoms, or history of kidney stones. He does not have pain at rest. The pain is felt when he attempts to sit up from lying. No radiation to abdomen, LE's or to groin.    The history is provided by the patient. No language interpreter was used.    Past Medical History:  Diagnosis Date  . Abnormality of gait 04/18/2014  . BPH (benign prostatic hyperplasia)   . Brainstem stroke (HCC)    right  . Chronic renal insufficiency   . Depression   . Dyslipidemia   . Foot drop, left 08/21/2014  . Gait disorder   . GERD (gastroesophageal reflux disease)   . 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 Seizures (HCC)   . Ventricular shunt in place     Patient Active Problem List   Diagnosis Date Noted  . Foot drop, left 08/21/2014  . 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 Abnormality of gait 04/18/2014    Past Surgical History:  Procedure Laterality Date  . athroscopic surgery knee Left   . CAROTID ENDARTERECTOMY    . umilical hernia repair         Home Medications    Prior to Admission medications   Medication Sig Start Date End Date Taking? Authorizing Provider  acetaminophen (TYLENOL) 325 MG tablet Take 650 mg by mouth every 4 (four) hours as needed.    Historical Provider, MD  AFLURIA PRESERVATIVE FREE 0.5 ML SUSY  07/07/14   Historical Provider, MD  amLODipine (NORVASC) 10 MG tablet Take 10 mg by mouth daily.    Historical Provider, MD  baclofen (LIORESAL) 20 MG tablet Take 1 tablet (20 mg total) by mouth 3 (three) times daily. 02/20/15   York Spaniel, MD  Cholecalciferol (VITAMIN D-3)  5000 UNITS TABS Take by mouth daily.    Historical Provider, MD  guaifenesin (ROBITUSSIN) 100 MG/5ML syrup Take 200 mg by mouth 3 (three) times daily as needed for cough.    Historical Provider, MD  levETIRAcetam (KEPPRA) 1000 MG tablet Take 1,000 mg by mouth 2 (two) times daily.    Historical Provider, MD  loperamide (IMODIUM) 2 MG capsule Take by mouth as needed for diarrhea or loose stools.    Historical Provider, MD  magnesium hydroxide (MILK OF MAGNESIA) 400 MG/5ML suspension Take by mouth daily as needed for mild constipation.    Historical Provider, MD  methocarbamol (ROBAXIN) 500 MG tablet Take 1 tablet (500 mg total) by mouth 2 (two) times daily. 10/01/16   Elpidio Anis, PA-C  metoprolol succinate (TOPROL-XL) 50 MG 24 hr tablet Take 50 mg by mouth 2 (two) times daily. Take with or immediately following a meal. Take one and one half tablets two times a day.    Historical Provider, MD  Multiple Vitamin (MULTIVITAMIN) tablet Take 1 tablet by mouth daily.    Historical Provider, MD  neomycin-bacitracin-polymyxin (NEOSPORIN) [phone] ointment Apply topically as needed.    Historical Provider, MD  oxyCODONE-acetaminophen (PERCOCET/ROXICET) 5-325 MG tablet Take 1 tablet by mouth every 4 (four) hours as needed for severe pain. 10/01/16   Elpidio Anis, PA-C  Polyethylene Glycol 3350 GRAN by Does not apply route.    Historical Provider, MD  selenium sulfide (ANTI-DANDRUFF) 1 % LOTN Apply 1 application topically daily.    Historical Provider, MD  simvastatin (ZOCOR) 10 MG tablet Take 10 mg by mouth daily.    Historical Provider, MD  tamsulosin (FLOMAX) 0.4 MG CAPS capsule Take 0.4 mg by mouth. Take two tabs in morning.    Historical Provider, MD  temazepam (RESTORIL) 30 MG capsule Take 30 mg by mouth at bedtime as needed for sleep.    Historical Provider, MD  tiZANidine (ZANAFLEX) 2 MG tablet Take 1 tablet (2 mg total) by mouth every 6 (six) hours as needed for muscle spasms. 02/20/15   York Spanielharles K  Willis, MD  traZODone (DESYREL) 100 MG tablet Take 100 mg by mouth at bedtime.    Historical Provider, MD    Family History Family History  Problem Relation Age of Onset  . Diabetes Mother     died of dm complication  . Brain cancer Father     brain tumor  . Alcoholism Sister     Social History Social History  Substance Use Topics  . Smoking status: Never Smoker  . Smokeless tobacco: Never Used  . Alcohol use No     Allergies   Patient has no known allergies.   Review of Systems Review of Systems  Constitutional: Negative for chills and fever.  Respiratory: Negative.   Cardiovascular: Negative.   Gastrointestinal: Positive for nausea. Negative for abdominal pain and diarrhea.  Genitourinary: Positive for flank pain. Negative for difficulty urinating, dysuria, hematuria and testicular pain.  Skin: Negative.   Neurological: Negative.      Physical Exam Updated Vital Signs BP 107/66 (BP Location: Right Arm)   Pulse (!) 57   Temp 97.8 F (36.6 C) (Oral)   Resp 16   Ht 5\' 11"  (1.803 m)   Wt 113.4 kg   SpO2 96%   BMI 34.87 kg/m   Physical Exam  Constitutional: He is oriented to person, place, and time. He appears well-developed and well-nourished. No distress.  HENT:  Head: Normocephalic.  Neck: Normal range of motion. Neck supple.  Cardiovascular: Normal rate and regular rhythm.   Pulmonary/Chest: Effort normal and breath sounds normal. He has no wheezes. He has no rales.  Abdominal: Soft. Bowel sounds are normal. There is no tenderness. There is no rebound and no guarding.  Genitourinary:  Genitourinary Comments: No CVA tenderness.   Musculoskeletal: Normal range of motion.  No midline or paralumbar tenderness.   Neurological: He is alert and oriented to person, place, and time.  Skin: Skin is warm and dry. No rash noted.  Psychiatric: He has a normal mood and affect.     ED Treatments / Results  Labs (all labs ordered are listed, but only abnormal  results are displayed) Labs Reviewed  URINALYSIS, ROUTINE W REFLEX MICROSCOPIC    EKG  EKG Interpretation None       Radiology No results found.  Procedures Procedures (including critical care time)  Medications Ordered in ED Medications  oxyCODONE-acetaminophen (PERCOCET/ROXICET) 5-325 MG per tablet 1 tablet (not administered)     Initial Impression / Assessment and Plan / ED Course  I have reviewed the triage vital signs and the nursing notes.  Pertinent labs & imaging results that were available during my care of the patient were reviewed by me and considered in my medical decision making (see chart for details).  Clinical Course  Patient with pain in right flank/back, worse with movement, gone with rest. No urinary symptoms or abnormal UA. No fever. Pain follows a muscular pattern. VSS. He can be discharged home with treatment of musculoskeletal pain.  Final Clinical Impressions(s) / ED Diagnoses   Final diagnoses:  Acute right-sided low back pain without sciatica    New Prescriptions New Prescriptions   METHOCARBAMOL (ROBAXIN) 500 MG TABLET    Take 1 tablet (500 mg total) by mouth 2 (two) times daily.   OXYCODONE-ACETAMINOPHEN (PERCOCET/ROXICET) 5-325 MG TABLET    Take 1 tablet by mouth every 4 (four) hours as needed for severe pain.     Elpidio Anis, PA-C 10/01/16 0437    Layla Maw Ward, DO 10/01/16 1610

## 2017-05-04 ENCOUNTER — Ambulatory Visit: Payer: Medicaid Other | Admitting: Adult Health

## 2017-05-05 ENCOUNTER — Encounter: Payer: Self-pay | Admitting: Adult Health

## 2017-06-24 ENCOUNTER — Ambulatory Visit (INDEPENDENT_AMBULATORY_CARE_PROVIDER_SITE_OTHER): Payer: Medicaid Other | Admitting: Adult Health

## 2017-06-24 ENCOUNTER — Encounter: Payer: Self-pay | Admitting: Adult Health

## 2017-06-24 VITALS — BP 137/79 | HR 56 | Ht 71.0 in | Wt 253.0 lb

## 2017-06-24 DIAGNOSIS — Z8679 Personal history of other diseases of the circulatory system: Secondary | ICD-10-CM

## 2017-06-24 DIAGNOSIS — G8194 Hemiplegia, unspecified affecting left nondominant side: Secondary | ICD-10-CM | POA: Diagnosis not present

## 2017-06-24 NOTE — Progress Notes (Signed)
PATIENT: Wesley [name] DOB: March 27, 1961  REASON FOR VISIT: follow up- stroke HISTORY FROM: patient  HISTORY OF PRESENT ILLNESS: Today 06/24/17  Wesley [name] is a 56 year old male with a history of right brain intracranial hemorrhage and left hemiparesis.He returns today follow-up. He states that he's been taking baclofen 20 mg twice a day. He was unaware that he could take it 3 times a day. He reports that there are days that he has more muscle stiffness and spasticity than others.The patient remains active. He states that he attends [employer] during the week and he works at [employer] on the weekend. He primarily ambulates via a motorized wheelchair. He does require some assistance with ADLs. He returns today for an evaluation.  HISTORY 05/01/16: Wesley [name] is a 56 year old male with a history of right brain intracranial hemorrhage and left hemi-paresis. He returns today for follow-up. He reports that the baclofen is still beneficial for his spasticity. She is currently taking 20 mg 3 times a day. He primarily uses a motorized wheelchair for ambulation. He states that he can stand and walk to the shower with a walker. He lives at [facility]. He states that he has someone help him with dressing. He eats all his meals there. Blood pressure has been in normal range. Reports his primary care is managing his cholesterol. Denies any new neurological symptoms. Reports that he heard of a study at [place] that may potentially help stroke victims again. He is considering contacting them to see if he qualifies for the study. He returns today for an evaluation.   REVIEW OF SYSTEMS: Out of a complete 14 system review of symptoms, the patient complains only of the following symptoms, and all other reviewed systems are negative.  See HPI  ALLERGIES: No Known Allergies  HOME MEDICATIONS: Outpatient Medications Prior to Visit  Medication Sig Dispense Refill  . acetaminophen (TYLENOL) 325 MG tablet  Take 650 mg by mouth every 4 (four) hours as needed.    Marland Kitchen AFLURIA PRESERVATIVE FREE 0.5 ML SUSY   0  . amLODipine (NORVASC) 10 MG tablet Take 10 mg by mouth daily.    . baclofen (LIORESAL) 20 MG tablet Take 1 tablet (20 mg total) by mouth 3 (three) times daily. 90 each 5  . Cholecalciferol (VITAMIN D-3) 5000 UNITS TABS Take by mouth daily.    Marland Kitchen guaifenesin (ROBITUSSIN) 100 MG/5ML syrup Take 200 mg by mouth 3 (three) times daily as needed for cough.    . levETIRAcetam (KEPPRA) 1000 MG tablet Take 1,000 mg by mouth 2 (two) times daily.    Marland Kitchen loperamide (IMODIUM) 2 MG capsule Take by mouth as needed for diarrhea or loose stools.    . magnesium hydroxide (MILK OF MAGNESIA) 400 MG/5ML suspension Take by mouth daily as needed for mild constipation.    . methocarbamol (ROBAXIN) 500 MG tablet Take 1 tablet (500 mg total) by mouth 2 (two) times daily. 20 tablet 0  . metoprolol succinate (TOPROL-XL) 50 MG 24 hr tablet Take 50 mg by mouth 2 (two) times daily. Take with or immediately following a meal. Take one and one half tablets two times a day.    . Multiple Vitamin (MULTIVITAMIN) tablet Take 1 tablet by mouth daily.    Marland Kitchen neomycin-bacitracin-polymyxin (NEOSPORIN) [phone] ointment Apply topically as needed.    Marland Kitchen oxyCODONE-acetaminophen (PERCOCET/ROXICET) 5-325 MG tablet Take 1 tablet by mouth every 4 (four) hours as needed for severe pain. 6 tablet 0  . Polyethylene Glycol 3350  GRAN by Does not apply route.    . selenium sulfide (ANTI-DANDRUFF) 1 % LOTN Apply 1 application topically daily.    . simvastatin (ZOCOR) 10 MG tablet Take 10 mg by mouth daily.    . tamsulosin (FLOMAX) 0.4 MG CAPS capsule Take 0.4 mg by mouth. Take two tabs in morning.    . temazepam (RESTORIL) 30 MG capsule Take 30 mg by mouth at bedtime as needed for sleep.    Marland Kitchen tiZANidine (ZANAFLEX) 2 MG tablet Take 1 tablet (2 mg total) by mouth every 6 (six) hours as needed for muscle spasms. 60 tablet 3  . traZODone (DESYREL) 100 MG  tablet Take 100 mg by mouth at bedtime.     No facility-administered medications prior to visit.     PAST MEDICAL HISTORY: Past Medical History:  Diagnosis Date  . Abnormality of gait 04/18/2014  . BPH (benign prostatic hyperplasia)   . Brainstem stroke (HCC)    right  . Chronic renal insufficiency   . Depression   . Dyslipidemia   . Foot drop, left 08/21/2014  . Gait disorder   . GERD (gastroesophageal reflux disease)   . 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 Seizures (HCC)   . Ventricular shunt in place     PAST SURGICAL HISTORY: Past Surgical History:  Procedure Laterality Date  . athroscopic surgery knee Left   . CAROTID ENDARTERECTOMY    . umilical hernia repair      FAMILY HISTORY: Family History  Problem Relation Age of Onset  . Diabetes Mother        died of dm complication  . Brain cancer Father        brain tumor  . Alcoholism Sister     SOCIAL HISTORY: Social History   Social History  . Marital status: Single    Spouse name: N/A  . Number of children: 2  . Years of education: 9th   Occupational History  . disability    Social History Main Topics  . Smoking status: Never Smoker  . Smokeless tobacco: Never Used  . Alcohol use No  . Drug use: No  . Sexual activity: Not on file   Other Topics Concern  . Not on file   Social History Narrative   Patient drinks about 2 cups of coffee daily.   Patient is right handed.   Resides at [facility] [phone]      PHYSICAL EXAM  Vitals:   06/24/17 1322  BP: 137/79  Pulse: (!) 56  Weight: 253 lb (114.8 kg)  Height:  (1.803 m)   Body mass index is 35.29 kg/m.  Generalized: Well developed, in no acute distress   Neurological examination  Mentation: Alert oriented to time, place, history taking. Follows all commands speech and language fluent Cranial nerve II-XII: Pupils were equal round reactive to light. Extraocular  movements were full, visual field were full on confrontational test. Facial sensation and strength were normal. Uvula tongue midline. Head turning and shoulder shrug  were normal and symmetric. Motor: The motor testing reveals 5 over 5 strength RUE and RLE. No movement in LUE, 2-3/5 in LLE Sensory: Sensory testing is intact to soft touch on all 4 extremities. No evidence of extinction is noted.  Coordination: Cerebellar testing reveals good finger-nose-finger and heel-to-shin on the right.  Gait and station: motorized wheelchair   DIAGNOSTIC DATA (LABS, IMAGING, TESTING) - I reviewed patient records, labs, notes, testing and imaging myself  where available.     ASSESSMENT AND PLAN 56 y.o. 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 here with:   1. History of intracranial hemorrhage 2. Left hemiparesis  Overall the patient has remained stable. I have encouraged him to try to take Baclofen 20 mg three times a day. He should continue to monitor his blood pressure and cholesterol. If his symptoms worsen of he develops new symptoms he should let us know. Follow-up in 1 year with Dr. Anne Hahn.   Butch Penny, MSN, NP-C 06/24/2017, 1:54 PM [facility] [address], [address] [place], [place] [address] [phone]

## 2017-06-24 NOTE — Progress Notes (Signed)
I have read the note, and I agree with the clinical assessment and plan.  WILLIS,CHARLES KEITH   

## 2017-06-24 NOTE — Patient Instructions (Signed)
Your Plan:  Continue Baclofen- try taking three times a day If your symptoms worsen or you develop new symptoms please let us know.    Thank you for coming to see [place] at [facility]. I hope we have been able to provide you high quality care today.  You may receive a patient satisfaction survey over the next few weeks. We would appreciate your feedback and comments so that we may continue to improve ourselves and the health of our patients.

## 2018-04-26 ENCOUNTER — Other Ambulatory Visit: Payer: Self-pay

## 2018-04-26 ENCOUNTER — Emergency Department (HOSPITAL_COMMUNITY): Payer: Medicaid Other

## 2018-04-26 ENCOUNTER — Encounter (HOSPITAL_COMMUNITY): Payer: Self-pay | Admitting: Emergency Medicine

## 2018-04-26 ENCOUNTER — Inpatient Hospital Stay (HOSPITAL_COMMUNITY)
Admission: EM | Admit: 2018-04-26 | Discharge: 2018-04-27 | DRG: 083 | Disposition: A | Discharge status: Home Health | Payer: Medicaid Other | Attending: Internal Medicine | Admitting: Internal Medicine

## 2018-04-26 DIAGNOSIS — S065X9A Traumatic subdural hemorrhage with loss of consciousness of unspecified duration, initial encounter: Principal | ICD-10-CM | POA: Diagnosis present

## 2018-04-26 DIAGNOSIS — G8194 Hemiplegia, unspecified affecting left nondominant side: Secondary | ICD-10-CM | POA: Diagnosis not present

## 2018-04-26 DIAGNOSIS — S065XAA Traumatic subdural hemorrhage with loss of consciousness status unknown, initial encounter: Secondary | ICD-10-CM | POA: Diagnosis present

## 2018-04-26 DIAGNOSIS — I4510 Unspecified right bundle-branch block: Secondary | ICD-10-CM | POA: Diagnosis present

## 2018-04-26 DIAGNOSIS — N189 Chronic kidney disease, unspecified: Secondary | ICD-10-CM | POA: Diagnosis not present

## 2018-04-26 DIAGNOSIS — G40909 Epilepsy, unspecified, not intractable, without status epilepticus: Secondary | ICD-10-CM | POA: Diagnosis present

## 2018-04-26 DIAGNOSIS — I129 Hypertensive chronic kidney disease with stage 1 through stage 4 chronic kidney disease, or unspecified chronic kidney disease: Secondary | ICD-10-CM | POA: Diagnosis present

## 2018-04-26 DIAGNOSIS — Z23 Encounter for immunization: Secondary | ICD-10-CM | POA: Diagnosis not present

## 2018-04-26 DIAGNOSIS — Z811 Family history of alcohol abuse and dependence: Secondary | ICD-10-CM | POA: Diagnosis not present

## 2018-04-26 DIAGNOSIS — S0101XA Laceration without foreign body of scalp, initial encounter: Secondary | ICD-10-CM | POA: Diagnosis present

## 2018-04-26 DIAGNOSIS — W050XXA Fall from non-moving wheelchair, initial encounter: Secondary | ICD-10-CM | POA: Diagnosis present

## 2018-04-26 DIAGNOSIS — R402142 Coma scale, eyes open, spontaneous, at arrival to emergency department: Secondary | ICD-10-CM | POA: Diagnosis present

## 2018-04-26 DIAGNOSIS — N400 Enlarged prostate without lower urinary tract symptoms: Secondary | ICD-10-CM | POA: Diagnosis present

## 2018-04-26 DIAGNOSIS — Z8673 Personal history of transient ischemic attack (TIA), and cerebral infarction without residual deficits: Secondary | ICD-10-CM | POA: Diagnosis not present

## 2018-04-26 DIAGNOSIS — Z833 Family history of diabetes mellitus: Secondary | ICD-10-CM

## 2018-04-26 DIAGNOSIS — M21372 Foot drop, left foot: Secondary | ICD-10-CM | POA: Diagnosis present

## 2018-04-26 DIAGNOSIS — Z993 Dependence on wheelchair: Secondary | ICD-10-CM | POA: Diagnosis not present

## 2018-04-26 DIAGNOSIS — E871 Hypo-osmolality and hyponatremia: Secondary | ICD-10-CM | POA: Diagnosis present

## 2018-04-26 DIAGNOSIS — F329 Major depressive disorder, single episode, unspecified: Secondary | ICD-10-CM | POA: Diagnosis not present

## 2018-04-26 DIAGNOSIS — W19XXXA Unspecified fall, initial encounter: Secondary | ICD-10-CM | POA: Diagnosis not present

## 2018-04-26 DIAGNOSIS — K219 Gastro-esophageal reflux disease without esophagitis: Secondary | ICD-10-CM | POA: Diagnosis present

## 2018-04-26 DIAGNOSIS — I69354 Hemiplegia and hemiparesis following cerebral infarction affecting left non-dominant side: Secondary | ICD-10-CM

## 2018-04-26 DIAGNOSIS — Z982 Presence of cerebrospinal fluid drainage device: Secondary | ICD-10-CM

## 2018-04-26 DIAGNOSIS — F419 Anxiety disorder, unspecified: Secondary | ICD-10-CM | POA: Diagnosis not present

## 2018-04-26 DIAGNOSIS — Z808 Family history of malignant neoplasm of other organs or systems: Secondary | ICD-10-CM | POA: Diagnosis not present

## 2018-04-26 DIAGNOSIS — I10 Essential (primary) hypertension: Secondary | ICD-10-CM | POA: Diagnosis not present

## 2018-04-26 DIAGNOSIS — E785 Hyperlipidemia, unspecified: Secondary | ICD-10-CM | POA: Diagnosis present

## 2018-04-26 DIAGNOSIS — E8770 Fluid overload, unspecified: Secondary | ICD-10-CM | POA: Diagnosis present

## 2018-04-26 DIAGNOSIS — R402252 Coma scale, best verbal response, oriented, at arrival to emergency department: Secondary | ICD-10-CM | POA: Diagnosis present

## 2018-04-26 DIAGNOSIS — R402362 Coma scale, best motor response, obeys commands, at arrival to emergency department: Secondary | ICD-10-CM | POA: Diagnosis present

## 2018-04-26 LAB — BASIC METABOLIC PANEL
Anion gap: 11 (ref 5–15)
BUN: 15 mg/dL (ref 6–20)
CHLORIDE: 98 mmol/L (ref 98–111)
CO2: 24 mmol/L (ref 22–32)
Calcium: 9 mg/dL (ref 8.9–10.3)
Creatinine, Ser: 1.05 mg/dL (ref 0.61–1.24)
GFR calc non Af Amer: >60 mL/min (ref >60)
Glucose, Bld: 105 mg/dL — ABNORMAL HIGH (ref 70–99)
POTASSIUM: 4.3 mmol/L (ref 3.5–5.1)
SODIUM: 133 mmol/L — AB (ref 135–145)

## 2018-04-26 LAB — CBC WITH DIFFERENTIAL/PLATELET
Basophils Absolute: 0 10*3/uL (ref 0.0–0.1)
Basophils Relative: 0 %
Eosinophils Absolute: 0 10*3/uL (ref 0.0–0.7)
Eosinophils Relative: 0 %
HCT: 42.9 % (ref 39.0–52.0)
HEMOGLOBIN: 14.8 g/dL (ref 13.0–17.0)
LYMPHS ABS: 1.5 10*3/uL (ref 0.7–4.0)
LYMPHS PCT: 24 %
MCH: 28.8 pg (ref 26.0–34.0)
MCHC: 34.5 g/dL (ref 30.0–36.0)
MCV: 83.6 fL (ref 78.0–100.0)
Monocytes Absolute: 0.5 10*3/uL (ref 0.1–1.0)
Monocytes Relative: 7 %
NEUTROS ABS: 4.5 10*3/uL (ref 1.7–7.7)
NEUTROS PCT: 69 %
Platelets: 233 10*3/uL (ref 150–400)
RBC: 5.13 MIL/uL (ref 4.22–5.81)
RDW: 13.2 % (ref 11.5–15.5)
WBC: 6.5 10*3/uL (ref 4.0–10.5)

## 2018-04-26 LAB — CBG MONITORING, ED: Glucose-Capillary: 123 mg/dL — ABNORMAL HIGH (ref 70–99)

## 2018-04-26 MED ORDER — ACETAMINOPHEN 325 MG PO TABS: 650.0000 mg | ORAL_TABLET | Freq: Four times a day (QID) | ORAL | Status: DC | PRN

## 2018-04-26 MED ORDER — AMLODIPINE BESYLATE 5 MG PO TABS
10.0000 mg | ORAL_TABLET | Freq: Every day | ORAL | Status: DC
  Administered 2018-04-26: 10 mg via ORAL
  Filled 2018-04-26: qty 2

## 2018-04-26 MED ORDER — HYDRALAZINE HCL 20 MG/ML IJ SOLN: 10.0000 mg | Freq: Four times a day (QID) | INTRAMUSCULAR | Status: DC | PRN

## 2018-04-26 MED ORDER — ACETAMINOPHEN 500 MG PO TABS
1000.0000 mg | ORAL_TABLET | Freq: Once | ORAL | Status: AC
  Administered 2018-04-26: 1000 mg via ORAL
  Filled 2018-04-26: qty 2

## 2018-04-26 MED ORDER — ONDANSETRON HCL 4 MG/2ML IJ SOLN: 4.0000 mg | Freq: Four times a day (QID) | INTRAMUSCULAR | Status: DC | PRN

## 2018-04-26 MED ORDER — TAMSULOSIN HCL 0.4 MG PO CAPS
0.8000 mg | ORAL_CAPSULE | Freq: Every day | ORAL | Status: DC
  Administered 2018-04-26: 0.8 mg via ORAL
  Filled 2018-04-26 (×2): qty 2

## 2018-04-26 MED ORDER — TRAZODONE HCL 100 MG PO TABS
100.0000 mg | ORAL_TABLET | Freq: Every day | ORAL | Status: DC
  Administered 2018-04-26: 100 mg via ORAL
  Filled 2018-04-26: qty 1

## 2018-04-26 MED ORDER — LEVETIRACETAM 500 MG PO TABS
1000.0000 mg | ORAL_TABLET | Freq: Two times a day (BID) | ORAL | Status: DC
  Administered 2018-04-26 – 2018-04-27 (×2): 1000 mg via ORAL
  Filled 2018-04-26 (×2): qty 2

## 2018-04-26 MED ORDER — TETANUS-DIPHTH-ACELL PERTUSSIS 5-2.5-18.5 LF-MCG/0.5 IM SUSP
0.5000 mL | Freq: Once | INTRAMUSCULAR | Status: AC
  Administered 2018-04-26: 0.5 mL via INTRAMUSCULAR
  Filled 2018-04-26: qty 0.5

## 2018-04-26 MED ORDER — SIMVASTATIN 10 MG PO TABS
10.0000 mg | ORAL_TABLET | Freq: Every day | ORAL | Status: DC
  Administered 2018-04-26 – 2018-04-27 (×2): 10 mg via ORAL
  Filled 2018-04-26 (×2): qty 1

## 2018-04-26 NOTE — H&P (Addendum)
History and Physical  Wesley RollingDavid L Downum ZOX:[id number] DOB: [birth year] DOA: 04/26/2018  Referring physician: Dr Lilian KapurMcDonald  PCP: Jannett Celestineaffey, Karen, PA-C  Outpatient Specialists: Unknown Patient coming from: ALF [facility]  Chief Complaint: Unwitnessed fall at ALF  HPI: Wesley [name] is a 57 y.o. male with medical history significant for seizure disorder, previous intracranial hemorrhage with left hemiparesis, hypertension, hyperlipidemia, BPH, chronic depression/anxiety who presented to [facility] ED after an unwitnessed fall from his wheelchair at his assisted living facility earlier today.  Patient is alert and oriented x3.  However he has no recollection of the event that led to his fall.  States that he has a habit of falling asleep in his wheelchair.  Denies history of OSA.  Denies tongue biting, urinary, or fecal incontinence.  Does not appear postictal.  States he was in his normal state of health prior to this.  Denies any cardiac or pulmonary symptoms. Admits to mild headache at laceration site.  ED Course: Vital signs remarkable for fever with T-max 101.3.  CT head with no contrast revealed acute subdural hematoma.  TRH asked to admit.  Review of Systems: Review of systems as noted in the HPI. All other systems reviewed and are negative.   Past Medical History:  Diagnosis Date  . Abnormality of gait 04/18/2014  . BPH (benign prostatic hyperplasia)   . Brainstem stroke (HCC)    right  . Chronic renal insufficiency   . Depression   . Dyslipidemia   . Foot drop, left 08/21/2014  . Gait disorder   . GERD (gastroesophageal reflux disease)   . 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 Seizures (HCC)   . Ventricular shunt in place    Past Surgical History:  Procedure Laterality Date  . athroscopic surgery knee Left   . CAROTID ENDARTERECTOMY    . umilical hernia repair      Social History:  reports that he has never smoked. He has  never used smokeless tobacco. He reports that he does not drink alcohol or use drugs.   No Known Allergies  Family History  Problem Relation Age of Onset  . Diabetes Mother        died of dm complication  . Brain cancer Father        brain tumor  . Alcoholism Sister      Prior to Admission medications   Medication Sig Start Date End Date Taking? Authorizing Provider  acetaminophen (TYLENOL) 325 MG tablet Take 650 mg by mouth every 4 (four) hours as needed.    [provider]  AFLURIA PRESERVATIVE FREE 0.5 ML SUSY  07/07/14   [provider]  amLODipine (NORVASC) 10 MG tablet Take 10 mg by mouth daily.    [provider]  baclofen (LIORESAL) 20 MG tablet Take 1 tablet (20 mg total) by mouth 3 (three) times daily. 02/20/15   York SpanielWillis, Charles K, MD  Cholecalciferol (VITAMIN D-3) 5000 UNITS TABS Take by mouth daily.    [provider]  guaifenesin (ROBITUSSIN) 100 MG/5ML syrup Take 200 mg by mouth 3 (three) times daily as needed for cough.    [provider]  levETIRAcetam (KEPPRA) 1000 MG tablet Take 1,000 mg by mouth 2 (two) times daily.    [provider]  loperamide (IMODIUM) 2 MG capsule Take by mouth as needed for diarrhea or loose stools.    [provider]  magnesium hydroxide (MILK OF MAGNESIA) 400 MG/5ML suspension Take by  mouth daily as needed for mild constipation.    [provider]  methocarbamol (ROBAXIN) 500 MG tablet Take 1 tablet (500 mg total) by mouth 2 (two) times daily. 10/01/16   Elpidio AnisUpstill, Shari, PA-C  metoprolol succinate (TOPROL-XL) 50 MG 24 hr tablet Take 50 mg by mouth 2 (two) times daily. Take with or immediately following a meal. Take one and one half tablets two times a day.    [provider]  Multiple Vitamin (MULTIVITAMIN) tablet Take 1 tablet by mouth daily.    [provider]  neomycin-bacitracin-polymyxin (NEOSPORIN) [phone] ointment Apply topically as needed.     [provider]  oxyCODONE-acetaminophen (PERCOCET/ROXICET) 5-325 MG tablet Take 1 tablet by mouth every 4 (four) hours as needed for severe pain. 10/01/16   Elpidio AnisUpstill, Shari, PA-C  Polyethylene Glycol 3350 GRAN by Does not apply route.    [provider]  selenium sulfide (ANTI-DANDRUFF) 1 % LOTN Apply 1 application topically daily.    [provider]  simvastatin (ZOCOR) 10 MG tablet Take 10 mg by mouth daily.    [provider]  tamsulosin (FLOMAX) 0.4 MG CAPS capsule Take 0.4 mg by mouth. Take two tabs in morning.    [provider]  temazepam (RESTORIL) 30 MG capsule Take 30 mg by mouth at bedtime as needed for sleep.    [provider]  traZODone (DESYREL) 100 MG tablet Take 100 mg by mouth at bedtime.    [provider]    Physical Exam: BP 135/90   Pulse 62   Temp 98.8 F (37.1 C) (Oral)   Resp 19   Ht 5\' 11"  (1.803 m)   Wt 117.9 kg   SpO2 98%   BMI 36.26 kg/m   . General: 57 y.o. year-old male well developed well nourished in no acute distress.  Alert and oriented x3. . Cardiovascular: Regular rate and rhythm with no rubs or gallops.  No thyromegaly or JVD noted.  1+ pitting edema in lower extremity bilaterally. 2/4 pulses in all 4 extremities. Marland Kitchen. Respiratory: Clear to auscultation with no wheezes or rales. Good inspiratory effort. . Abdomen: Soft nontender nondistended with normal bowel sounds x4 quadrants. . Muskuloskeletal: No cyanosis, clubbing.1+ pitting edema in LE bilaterally. Left sided weakness in upper and lower extremities. . Neuro: CN II-XII intact, sensation intact. Diminished strength in left upper and lower extremities. No aphasia. . Skin: No ulcerative lesions noted or rashes; laceration noted left posterior skull. Marland Kitchen. Psychiatry: Judgement and insight appear normal. Mood is appropriate for condition and setting          Labs on Admission:  Basic Metabolic Panel: Recent Labs  Lab 04/26/18 1531  NA  133*  K 4.3  CL 98  CO2 24  GLUCOSE 105*  BUN 15  CREATININE 1.05  CALCIUM 9.0   Liver Function Tests: No results for input(s): AST, ALT, ALKPHOS, BILITOT, PROT, ALBUMIN in the last 168 hours. No results for input(s): LIPASE, AMYLASE in the last 168 hours. No results for input(s): AMMONIA in the last 168 hours. CBC: Recent Labs  Lab 04/26/18 1531  WBC 6.5  NEUTROABS 4.5  HGB 14.8  HCT 42.9  MCV 83.6  PLT 233   Cardiac Enzymes: No results for input(s): CKTOTAL, CKMB, CKMBINDEX, TROPONINI in the last 168 hours.  BNP (last 3 results) No results for input(s): BNP in the last 8760 hours.  ProBNP (last 3 results) No results for input(s): PROBNP in the last 8760 hours.  CBG: Recent Labs  Lab 04/26/18 1536  GLUCAP 123*    Radiological Exams on Admission: Ct Head Wo Contrast  Result Date: 04/26/2018 CLINICAL DATA:  Larey Seat from a wheelchair. Laceration to the posterior aspect of the head. EXAM: CT HEAD WITHOUT CONTRAST TECHNIQUE: Contiguous axial images were obtained from the base of the skull through the vertex without intravenous contrast. COMPARISON:  None. FINDINGS: Brain: There is an acute subdural hematoma on the left along the convexity with maximal thickness of 5-6 mm. Mild mass effect with left-to-right midline shift of 3 mm. No evidence of intraparenchymal hemorrhage. 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 No suspicion of shunt malfunction. No sign of previous ischemic infarction or mass lesion. Vascular: There is atherosclerotic calcification of the major vessels at the base of the brain. Skull: No acute skull fracture. Sinuses/Orbits: Clear/normal Other: None IMPRESSION: Acute subdural hematoma along the convexity on the left, maximal thickness 5-6 mm. Mild mass effect with left-to-right shift of 3 mm. Previous VP shunt. Critical Value/emergent results were called by telephone at the time of interpretation on 04/26/2018 at  4:21 pm to [facility] , who verbally acknowledged these results. Electronically Signed   By: Paulina Fusi M.D.   On: 04/26/2018 16:22    EKG: I independently viewed the EKG done and my findings are as followed: Sinus rhythm with rate of 57 and right bundle branch block.  Assessment/Plan Present on Admission: . Subdural hematoma (HCC)  Active Problems:   Subdural hematoma (HCC)  Acute subdural hematoma status post unspecified fall Patient does not recall the events that led to the fall He thinks he may have fallen asleep in his wheelchair and did not get a chance to catch himself from falling Neurosurgery Dr. Lovell Sheehan consulted by ED physician and recommended transfer to [facility] Neurochecks every 4 hours May need to repeat imaging if new deficits arise Hold off anticoagulation and antiplatelets Repeat CT head in the morning  Fever Suspect secondary to acute subdural hematoma Tylenol as needed Continue to monitor vital signs  Mild hypervolemic hyponatremia Sodium 133 If persistent in the morning obtain serum osmolality, urine osmolality and urine sodium Repeat BMP in the morning  History of CVA with previous intracranial hemorrhage with left side hemiparesis Hold off antiplatelets N.p.o. until otherwise recommended by neurosurgery Fall precautions  Essential hypertension Blood pressure is stable Resume home medications after med rec  History of seizure disorder Continue Keppra 1000 mg twice daily Seizure precautions  Hyperlipidemia On simvastatin  BPH Resume Flomax Monitor urine output  Chronic depression/anxiety Resume temazepam nightly as needed and trazodone nightly   Risks: Moderate to severe due to present condition with acute subdural hematoma, history of CVA and previous intra-cranial hemorrhage with left hemiparesis, seizure disorder, multiple comorbidities, and age.    DVT prophylaxis: SCDs  Code Status: Full code  Family  Communication: None at bedside  Disposition Plan: Admit to telemetry/neuro unit at [facility]  Consults called: Neurosurgery, Dr. Lovell Sheehan, consulted by ED physician  Admission status: Inpatient  Patient will require at least 2 midnights for further testings and treatment of present condition.    Darlin Drop MD [facility] Pager [phone]  If 7PM-7AM, please contact night-coverage [email or web address] Password [facility]  04/26/2018, 5:33 PM

## 2018-04-26 NOTE — Consult Note (Signed)
Reason for Consult: Left subdural hematoma Referring Physician: Dr. [name] is an 57 y.o. male.  HPI: The patient is a 57 year old black male whose had a previous left brain hemorrhage many years ago which evidently was treated at [facility].  Has had a shunt placed in there.  He was left with a dense left hemiparasis.  Made a fairly good recovery and resides at [facility] assisted living.  Patient normally gets around in a wheelchair.  The patient took a fall this morning and was brought to [facility] long ER.  A head CT was obtained which demonstrated a left subdural hematoma.  A neurosurgical consultation was requested.  Presently the patient is alert and pleasant.  He denies neck pain, back pain, seizures, etc.  He does not take any blood thinners.  Past Medical History:  Diagnosis Date  . Abnormality of gait 04/18/2014  . BPH (benign prostatic hyperplasia)   . Brainstem stroke ([place])    right  . Chronic renal insufficiency   . Depression   . Dyslipidemia   . Foot drop, left 08/21/2014  . Gait disorder   . GERD (gastroesophageal reflux disease)   . Hemiparesis affecting left side as late effect of cerebrovascular accident ([facility] [facility] [place]) 04/18/2014  . Seizures ([place])   . Ventricular shunt in place     Past Surgical History:  Procedure Laterality Date  . athroscopic surgery knee Left   . CAROTID ENDARTERECTOMY    . umilical hernia repair      Family History  Problem Relation Age of Onset  . Diabetes Mother        died of dm complication  . Brain cancer Father        brain tumor  . Alcoholism Sister     Social History:  reports that he has never smoked. He has never used smokeless tobacco. He reports that he does not drink alcohol or use drugs.  Allergies: No Known Allergies  Medications:  I have reviewed the patient's current medications. Prior to Admission:  (Not in a hospital admission) Scheduled: . amLODipine  10 mg Oral Daily  . levETIRAcetam  1,000 mg Oral  BID  . simvastatin  10 mg Oral Daily  . tamsulosin  0.4 mg Oral QPC supper  . traZODone  100 mg Oral QHS   Continuous:  XKG:[id number], ondansetron (ZOFRAN) IV Anti-infectives (From admission, onward)   None       Results for orders placed or performed during the hospital encounter of 04/26/18 (from the past 48 hour(s))  Basic metabolic panel     Status: Abnormal   Collection Time: 04/26/18  3:31 PM  Result Value Ref Range   Sodium 133 (L) 135 - 145 mmol/L   Potassium 4.3 3.5 - 5.1 mmol/L   Chloride 98 98 - 111 mmol/L   CO2 24 22 - 32 mmol/L   Glucose, Bld 105 (H) 70 - 99 mg/dL   BUN 15 6 - 20 mg/dL   Creatinine, Ser 1.05 0.61 - 1.24 mg/dL   Calcium 9.0 8.9 - 10.3 mg/dL   GFR calc non Af Amer >60 >60 mL/min   GFR calc Af Amer >60 >60 mL/min    Comment: (NOTE) The eGFR has been calculated using the CKD EPI equation. This calculation has not been validated in all clinical situations. eGFR's persistently <60 mL/min signify possible Chronic Kidney Disease.    Anion gap 11 5 - 15    Comment: Performed at [facility], [address] [place], [place] [address]  CBC WITH DIFFERENTIAL     Status: None   Collection Time: 04/26/18  3:31 PM  Result Value Ref Range   WBC 6.5 4.0 - 10.5 K/uL   RBC 5.13 4.22 - 5.81 MIL/uL   Hemoglobin 14.8 13.0 - 17.0 g/dL   HCT 42.9 39.0 - 52.0 %   MCV 83.6 78.0 - 100.0 fL   MCH 28.8 26.0 - 34.0 pg   MCHC 34.5 30.0 - 36.0 g/dL   RDW 13.2 11.5 - 15.5 %   Platelets 233 150 - 400 K/uL   Neutrophils Relative % 69 %   Neutro Abs 4.5 1.7 - 7.7 K/uL   Lymphocytes Relative 24 %   Lymphs Abs 1.5 0.7 - 4.0 K/uL   Monocytes Relative 7 %   Monocytes Absolute 0.5 0.1 - 1.0 K/uL   Eosinophils Relative 0 %   Eosinophils Absolute 0.0 0.0 - 0.7 K/uL   Basophils Relative 0 %   Basophils Absolute 0.0 0.0 - 0.1 K/uL    Comment: Performed at [facility], [place] [address], [place], [place] [address]  CBG monitoring, ED      Status: Abnormal   Collection Time: 04/26/18  3:36 PM  Result Value Ref Range   Glucose-Capillary 123 (H) 70 - 99 mg/dL    Ct Head Wo Contrast  Result Date: 04/26/2018 CLINICAL DATA:  [place] from a wheelchair. Laceration to the posterior aspect of the head. EXAM: CT HEAD WITHOUT CONTRAST TECHNIQUE: Contiguous axial images were obtained from the base of the skull through the vertex without intravenous contrast. COMPARISON:  None. FINDINGS: Brain: There is an acute subdural hematoma on the left along the convexity with maximal thickness of 5-6 mm. Mild mass effect with left-to-right midline shift of 3 mm. No evidence of intraparenchymal hemorrhage. 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 No suspicion of shunt malfunction. No sign of previous ischemic infarction or mass lesion. Vascular: There is atherosclerotic calcification of the major vessels at the base of the brain. Skull: No acute skull fracture. Sinuses/Orbits: Clear/normal Other: None IMPRESSION: Acute subdural hematoma along the convexity on the left, maximal thickness 5-6 mm. Mild mass effect with left-to-right shift of 3 mm. Previous VP shunt. Critical Value/emergent results were called by telephone at the time of interpretation on 04/26/2018 at 4:21 pm to [name] , who verbally acknowledged these results. Electronically Signed   By: [name] M.D.   On: 04/26/2018 16:22    ROS: As above Blood pressure 138/82, pulse (!) 56, temperature 98.8 F (37.1 C), temperature source Oral, resp. rate (!) 21, height 5' 11"  (1.803 m), weight 117.9 kg, SpO2 96 %. Estimated body mass index is 36.26 kg/m as calculated from the following:   Height as of this encounter: 5' 11"  (1.803 m).   Weight as of this encounter: 117.9 kg.  Physical Exam  General: An alert and pleasant obese 57 year old black male in no apparent distress.  HEENT: Normocephalic, pupils equal round react to light,  extraocular muscles intact.  There is no evidence of CSF otorrhea or rhinorrhea.  Neck: Supple without obvious deformities.  There is no tenderness.  Thorax: Symmetric  Abdomen: Obese and soft  Back exam: Unremarkable  Extremities: Unremarkable  Neurologic exam: The patient is alert and oriented x3.  Glasgow Coma Scale 15.  Cranial nerves II through XII were examined bilaterally and grossly normal.  Vision and hearing are grossly  normal bilaterally.  The patient is densely paretic in his left upper extremity.  He has 4-/5 strength in his left quadricep and gastrocnemius.  His strength is normal on the right.  Sensory function is intact to light touch sensation all tested dermatomes bilaterally.  Imaging studies I reviewed the patient's head CT performed at [facility] today.  He has a right ventriculoperitoneal shunt.  He has an acute left subdural hematoma with mild mass-effect.    Assessment/Plan: Left subdural hematoma: The patient is going to be transferred to [facility] on the hospitalist service for observation in the unlikely event that he should come to need surgery.  I will plan to repeat his CAT scan tomorrow.  If it is without change he can be discharged tomorrow follow-up with me on a as needed basis.  Have answered all his questions.  Wesley [name] 04/26/2018, 6:23 PM

## 2018-04-26 NOTE — ED Notes (Signed)
[facility] notified of transportation needs.

## 2018-04-26 NOTE — ED Provider Notes (Addendum)
[facility]-EMERGENCY DEPT Provider Note   CSN: [id number] Arrival date & time: 04/26/18  1323     History   Chief Complaint Chief Complaint  Patient presents with  . Fall    HPI Wesley [name] is a 57 y.o. male who is a past medical history of a previous intracranial hemorrhage with left-sided hemiparesis, status post VP shunt who presents the emergency department after fall.  The patient states that he does not remember what happened but remembers falling and hitting his head.  He did not lose consciousness.  I spoke with a nurse tech at the [facility] where [facility] is a resident.  She states that his fall was unwitnessed but they did hear him fall and ran immediately to check on him said that he was awake and alert but could not tell them what happened however she thinks that he miscalculated when trying to self transfer and had a mechanical fall.  She states that he did not appear to be confused.  He does have a previous history of seizures but she states that she does not think that that was the case.  He is not on any blood thinning medications.  He did suffer a slight laceration to the posterior skull.  He is unsure of his last tetanus vaccination HPI  Past Medical History:  Diagnosis Date  . Abnormality of gait 04/18/2014  . BPH (benign prostatic hyperplasia)   . Brainstem stroke (HCC)    right  . Chronic renal insufficiency   . Depression   . Dyslipidemia   . Foot drop, left 08/21/2014  . Gait disorder   . GERD (gastroesophageal reflux disease)   . 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 Seizures (HCC)   . Ventricular shunt in place     Patient Active Problem List   Diagnosis Date Noted  . Subdural hematoma (HCC) 04/26/2018  . Foot drop, left 08/21/2014  . 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 Abnormality of gait 04/18/2014    Past  Surgical History:  Procedure Laterality Date  . athroscopic surgery knee Left   . CAROTID ENDARTERECTOMY    . umilical hernia repair          Home Medications    Prior to Admission medications   Medication Sig Start Date End Date Taking? Authorizing Provider  acetaminophen (TYLENOL) 500 MG tablet Take 500 mg by mouth every 4 (four) hours as needed for fever or headache.    Yes [provider]  alum & mag hydroxide-simeth (MAALOX PLUS) 400-400-40 MG/5ML suspension Take 15 mLs by mouth as needed for indigestion.   Yes [provider]  baclofen (LIORESAL) 20 MG tablet Take 1 tablet (20 mg total) by mouth 3 (three) times daily. 02/20/15  Yes York SpanielWillis, Charles K, MD  bisacodyl (DULCOLAX) 5 MG EC tablet Take 5 mg by mouth every other day.   Yes [provider]  ibuprofen (ADVIL,MOTRIN) 800 MG tablet Take 800 mg by mouth every 6 (six) hours as needed (pain).   Yes [provider]  magnesium citrate SOLN Take 1 Bottle by mouth See admin instructions. Take every 2 weeks as needed for severe constipation   Yes [provider]  magnesium hydroxide (MILK OF MAGNESIA) 400 MG/5ML suspension Take 30 mLs by mouth at bedtime as needed for mild constipation.    Yes [provider]  Melatonin 5 MG TABS Take  5 mg by mouth every evening.   Yes [provider]  Multiple Vitamin (MULTIVITAMIN) tablet Take 1 tablet by mouth daily.   Yes [provider]  neomycin-bacitracin-polymyxin (NEOSPORIN) [phone] ointment Apply 1 application topically as needed (skin tears).    Yes [provider]  polyethylene glycol (MIRALAX / GLYCOLAX) packet Take 17 g by mouth daily as needed for mild constipation.   Yes [provider]  senna-docusate (SENOKOT-S) 8.6-50 MG tablet Take 1 tablet by mouth See admin instructions. Take 1 tablet by mouth three times a week as needed for constipation   Yes [provider]  simvastatin (ZOCOR) 10  MG tablet Take 10 mg by mouth daily.   Yes [provider]  tamsulosin (FLOMAX) 0.4 MG CAPS capsule Take 0.4 mg by mouth at bedtime.    Yes [provider]  traZODone (DESYREL) 100 MG tablet Take 100 mg by mouth every evening.    Yes [provider]  guaifenesin (ROBITUSSIN) 100 MG/5ML syrup Take 200 mg by mouth every 6 (six) hours as needed for cough.     [provider]  metoprolol tartrate (LOPRESSOR) 100 MG tablet Take 0.5 tablets (50 mg total) by mouth 2 (two) times daily. Hold lopressor if sbp less than 90. 04/27/18   Albertine GratesXu, Fang, MD    Family History Family History  Problem Relation Age of Onset  . Diabetes Mother        died of dm complication  . Brain cancer Father        brain tumor  . Alcoholism Sister     Social History Social History   Tobacco Use  . Smoking status: Never Smoker  . Smokeless tobacco: Never Used  Substance Use Topics  . Alcohol use: No  . Drug use: No     Allergies   Patient has no known allergies.   Review of Systems Review of Systems Ten systems reviewed and are negative for acute change, except as noted in the HPI.    Physical Exam Updated Vital Signs BP 110/74 (BP Location: Left Arm)   Pulse (!) 59   Temp 98.3 F (36.8 C) (Oral)   Resp 17   Ht 5\' 9"  (1.753 m)   Wt 117.4 kg   SpO2 98%   BMI 38.22 kg/m   Physical Exam  Constitutional: He is oriented to person, place, and time. He appears well-developed and well-nourished. No distress.  HENT:  Head: Normocephalic.  4 cm elliptical laceration over the left parietal region.  Borders are well aligned, now no bleeding.  Eyes: Conjunctivae are normal. No scleral icterus.  Neck: Normal range of motion. Neck supple.  Cardiovascular: Normal rate, regular rhythm and normal heart sounds.  Pulmonary/Chest: Effort normal and breath sounds normal. No respiratory distress.  Abdominal: Soft. There is no tenderness.  Musculoskeletal: He exhibits no edema.    Neurological: He is alert and oriented to person, place, and time.  Patient with left-sided hemiparesis of the arm and lower extremity Speech is clear and goal oriented, follows commands Major Cranial nerves without deficit, no facial droop Normal strength in upper and lower right extremity Sensation normal to light and sharp touch Moves right upper and lower extremities without ataxia, coordination intact Normal finger to nose and rapid alternating movements on the right side  Skin: Skin is warm and dry. He is not diaphoretic.  Psychiatric: His behavior is normal.  Nursing note and vitals reviewed.    ED Treatments / Results  Labs (all  labs ordered are listed, but only abnormal results are displayed) Labs Reviewed  BASIC METABOLIC PANEL - Abnormal; Notable for the following components:      Result Value   Sodium 133 (*)    Glucose, Bld 105 (*)    All other components within normal limits  CBG MONITORING, ED - Abnormal; Notable for the following components:   Glucose-Capillary 123 (*)    All other components within normal limits  CBC WITH DIFFERENTIAL/PLATELET  URINALYSIS, ROUTINE W REFLEX MICROSCOPIC  CBC  BASIC METABOLIC PANEL    EKG None  Radiology Ct Head Wo Contrast  Addendum Date: 04/27/2018   ADDENDUM REPORT: 04/27/2018 01:31 ADDENDUM: Subsequent submitted coronal reconstruction images demonstrates a trace volume of subarachnoid hemorrhage within the left sylvian fissure and within a sulcus of the left parietal lobe which is new from the prior study (series 7, image 46). These results will be called to the ordering clinician or representative by the Radiologist Assistant, and communication documented in the PACS or zVision Dashboard. Electronically Signed   By: Mitzi Hansen M.D.   On: 04/27/2018 01:31   Result Date: 04/27/2018 CLINICAL DATA:  57 y/o M; fall with subdural hematoma for follow-up. EXAM: CT HEAD WITHOUT CONTRAST TECHNIQUE: Contiguous axial  images were obtained from the base of the skull through the vertex without intravenous contrast. COMPARISON:  04/26/2018 CT head. FINDINGS: Brain: Stable 5 mm subdural hematoma over the left cerebral convexity. Stable mass effect with 3 mm of left-to-right midline shift. Stable right parietal approach ventriculostomy catheter with tip in the posterior aspect of right lateral ventricle. No new acute intracranial hemorrhage, stroke, or focal mass effect. Vascular: Calcific atherosclerosis of carotid siphons. No hyperdense vessel. Skull: Chronic postsurgical changes related to a right parietal burr hole. No acute osseous abnormality. Sinuses/Orbits: No acute finding. Other: None. IMPRESSION: Stable 5 mm left cerebral convexity subdural hematoma with 3 mm of left-to-right midline shift. No new acute intracranial abnormality. Electronically Signed: By: Mitzi Hansen M.D. On: 04/27/2018 01:06   Ct Head Wo Contrast  Result Date: 04/26/2018 CLINICAL DATA:  Larey Seat from a wheelchair. Laceration to the posterior aspect of the head. EXAM: CT HEAD WITHOUT CONTRAST TECHNIQUE: Contiguous axial images were obtained from the base of the skull through the vertex without intravenous contrast. COMPARISON:  None. FINDINGS: Brain: There is an acute subdural hematoma on the left along the convexity with maximal thickness of 5-6 mm. Mild mass effect with left-to-right midline shift of 3 mm. No evidence of intraparenchymal hemorrhage. 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 No suspicion of shunt malfunction. No sign of previous ischemic infarction or mass lesion. Vascular: There is atherosclerotic calcification of the major vessels at the base of the brain. Skull: No acute skull fracture. Sinuses/Orbits: Clear/normal Other: None IMPRESSION: Acute subdural hematoma along the convexity on the left, maximal thickness 5-6 mm. Mild mass effect with left-to-right shift of 3 mm.  Previous VP shunt. Critical Value/emergent results were called by telephone at the time of interpretation on 04/26/2018 at 4:21 pm to [facility] , who verbally acknowledged these results. Electronically Signed   By: Paulina Fusi M.D.   On: 04/26/2018 16:22    Procedures .Marland KitchenLaceration Repair Date/Time: 04/26/2018 4:40 PM Performed by: Arthor Captain, PA-C Authorized by: Arthor Captain, PA-C   Consent:    Consent obtained:  Verbal   Consent given by:  Patient   Risks discussed:  Infection, need for additional repair and poor  cosmetic result Laceration details:    Location:  Scalp   Scalp location:  L parietal   Length (cm):  4 Repair type:    Repair type:  Simple Treatment:    Area cleansed with:  Saline   Amount of cleaning:  Standard Skin repair:    Repair method:  Tissue adhesive Post-procedure details:    Patient tolerance of procedure:  Tolerated well, no immediate complications   (including critical care time)  Medications Ordered in ED Medications  acetaminophen (TYLENOL) tablet 650 mg (has no administration in time range)  levETIRAcetam (KEPPRA) tablet 1,000 mg (1,000 mg Oral Given 04/27/18 0915)  simvastatin (ZOCOR) tablet 10 mg (10 mg Oral Given 04/27/18 0915)  tamsulosin (FLOMAX) capsule 0.8 mg (0.8 mg Oral Given 04/26/18 2205)  traZODone (DESYREL) tablet 100 mg (100 mg Oral Given 04/26/18 2204)  ondansetron (ZOFRAN) injection 4 mg (has no administration in time range)  hydrALAZINE (APRESOLINE) injection 10 mg (has no administration in time range)  0.9 %  sodium chloride infusion ( Intravenous New Bag/Given 04/27/18 0918)  Tdap (BOOSTRIX) injection 0.5 mL (0.5 mLs Intramuscular Given 04/26/18 1504)  acetaminophen (TYLENOL) tablet 1,000 mg (1,000 mg Oral Given 04/26/18 1659)     Initial Impression / Assessment and Plan / ED Course  I have reviewed the triage vital signs and the nursing notes.  Pertinent labs & imaging results that were available during my care of  the patient were reviewed by me and considered in my medical decision making (see chart for details).  Clinical Course as of Apr 27 1724  Mon Apr 26, 2018  1626 Spoke with PA Tiburcio Pea who received call from radiology regarding a subdural hematoma on head CT. Neurosurgery paged.    [MM]  1639 With acute subdural hematoma with mild mass-effect.  I spoke directly with Dr. Karin Golden who informed me of the scan results.  The patient has been signed out to [employer] who will assume care of the patient and consult with neurosurgery.  He has an otherwise normal neurologic exam with no acute deficits besides his chronic left hemiparesis.   [AH]  [id number] Spoke with Dr. Lovell Sheehan with neurosurgery.  Patient is not currently followed by neurosurgery and has not [facility] since his VP shunt was placed around 2010.  Dr. Lovell Sheehan advises transfer to [facility] for medical admit.   [MM]    Clinical Course User Index [AH] Arthor Captain, PA-C [MM] Barkley Boards, PA-C      Final Clinical Impressions(s) / ED Diagnoses   Final diagnoses:  Subdural hematoma (HCC)  Fall, initial encounter  Laceration of scalp, initial encounter    ED Discharge Orders         Ordered    Increase activity slowly     04/27/18 1211    Diet - low sodium heart healthy     04/27/18 1211    metoprolol tartrate (LOPRESSOR) 100 MG tablet  2 times daily     04/27/18 1338           Arthor Captain, PA-C 04/26/18 1642    Arthor Captain, PA-C 04/27/18 1725    Shaune Pollack, MD 04/29/18 1234

## 2018-04-26 NOTE — ED Triage Notes (Signed)
Patient presents via [facility] from assisted living.  Facility reported patient fell from wheelchair.  Patient has no memory of fall, able to recall previous activities of day.  Fall unwitnessed. Laceration to posterior head.

## 2018-04-26 NOTE — ED Notes (Signed)
Patient complained of brief period of vertigo while RN and NT were logrolling patient to change gown, linens.  Vertigo resolved once patient supine.

## 2018-04-26 NOTE — ED Notes (Signed)
Bed: WA05 Expected date:  Expected time:  Means of arrival:  Comments: EMS-fall 

## 2018-04-26 NOTE — ED Provider Notes (Signed)
57 year old received at sign out from PA VandemereHarris pending labs and imaging. Per her HPI:  "Wesley [name] is a 57 y.o. male who is a past medical history of a previous intracranial hemorrhage with left-sided hemiparesis, status post VP shunt who presents the emergency department after fall.  The patient states that he does not remember what happened but remembers falling and hitting his head.  He did not lose consciousness.  I spoke with a nurse tech at the [facility] where he is a resident.  She states that his fall was unwitnessed but they did hear him fall and ran immediately to check on him said that he was awake and alert but could not tell them what happened however she thinks that he miscalculated when trying to self transfer and had a mechanical fall.  She states that he did not appear to be confused.  He does have a previous history of seizures but she states that she does not think that that was the case.  He is not on any blood thinning medications.  He did suffer a slight laceration to the posterior skull.  He is unsure of his last tetanus vaccination."  Physical Exam  BP 135/90   Pulse 62   Temp 98.8 F (37.1 C) (Oral)   Resp 19   Ht 5\' 11"  (1.803 m)   Wt 117.9 kg   SpO2 98%   BMI 36.26 kg/m   Physical Exam  Well appearing. NAD.  Speaks in complete, fluent sentences.   ED Course/Procedures   Clinical Course as of Apr 26 1721  Mon Apr 26, 2018  1626 Spoke with PA Tiburcio PeaHarris who received call from radiology regarding a subdural hematoma on head CT. Neurosurgery paged.    [MM]  1639 With acute subdural hematoma with mild mass-effect.  I spoke directly with Dr. Karin GoldenShogry who informed me of the scan results.  The patient has been signed out to [employer] [facility] who will assume care of the patient and consult with neurosurgery.  He has an otherwise normal neurologic exam with no acute deficits besides his chronic left hemiparesis.   [AH]  [id number] Spoke with Dr. Lovell SheehanJenkins with neurosurgery.   Patient is not currently followed by neurosurgery and [facility] since his VP shunt was placed around 2010.  Dr. Lovell SheehanJenkins advises [facility].   [MM]    Clinical Course User Index [AH] Arthor CaptainHarris, Abigail, PA-C [MM] Caz Weaver A, PA-C    .Critical Care Performed by: Barkley BoardsMcDonald, Nikoloz Huy A, PA-C Authorized by: Barkley BoardsMcDonald, Marik Sedore A, PA-C   Critical care provider statement:    Critical care time (minutes):  35   Critical care time was exclusive of:  Separately billable procedures and treating other patients and teaching time   Critical care was necessary to treat or prevent imminent or life-threatening deterioration of the following conditions:  CNS failure or compromise   Critical care was time spent personally by me on the following activities:  Ordering and review of radiographic studies, ordering and review of laboratory studies, re-evaluation of patient's condition, review of old charts and discussions with consultants    MDM   57 year old male with a h/o of a previous intracranial hemorrhage with left-sided hemiparesis, status post VP shunt [name] and [facility]. Scalp laceration repaired by PA Harris.  Please see her note for full procedure details.  CT head with subdural hematoma with  3 mm midline shift.  Spoke with Dr. Lovell SheehanJenkins, neurosurgery, who recommends [facility] and medical [facility].  Neurosurgery will plan to see the patient after he is transferred.  Tylenol given for pain control in the ED.  Labs are otherwise notable for mild hyponatremia of 133.  Spoke with Dr. Butler Denmarkizwan, hospitalist, who will accept the patient for admission.  The patient remains hemodynamically stable.  He is alert and oriented x3. The patient appears reasonably stabilized for admission considering the current resources, flow, and capabilities available in the ED at this time, and I doubt any  other [facility] requiring further screening and/or treatment in the ED prior to admission. The patient appears reasonably stabilized for admission considering the current resources, flow, and capabilities available in the ED at this time, and I doubt any other [facility] requiring further screening and/or treatment in the ED prior to admission.    Frederik PearMcDonald, Cynthya Yam A, PA-C 04/26/18 1722    Mancel BaleWentz, Elliott, MD 04/27/18 1058

## 2018-04-26 NOTE — ED Notes (Addendum)
[facility] given to HollandMiranda, [facility]

## 2018-04-26 NOTE — ED Notes (Signed)
ED TO INPATIENT HANDOFF REPORT  Name/Age/Gender Wesley [name] 57 y.o. male  Code Status    Code Status Orders  (From admission, onward)         Start     Ordered   04/26/18 1736  Full code  Continuous     04/26/18 1735        Code Status History    This patient has a current code status but no historical code status.      Home/SNF/Other Nursing Home  Chief Complaint fall  Level of Care/Admitting Diagnosis ED Disposition    ED Disposition Condition [facility] Area: [place] [100100]  Level of Care: Telemetry [5]  Diagnosis: Subdural hematoma [facility]) [[id number]]  Admitting Physician: Kayleen Memos [[id number]]  Attending Physician: Kayleen Memos [[id number]]  Estimated length of stay: past midnight tomorrow  Certification:: I certify this patient will need inpatient services for at least 2 midnights  PT Class (Do Not Modify): Inpatient [101]  PT Acc Code (Do Not Modify): Private [1]       Medical History Past Medical History:  Diagnosis Date  . Abnormality of gait 04/18/2014  . BPH (benign prostatic hyperplasia)   . Brainstem stroke ([place])    right  . Chronic renal insufficiency   . Depression   . Dyslipidemia   . Foot drop, left 08/21/2014  . Gait disorder   . GERD (gastroesophageal reflux disease)   . Hemiparesis affecting left side as late effect of cerebrovascular accident ([place]) 04/18/2014  . Seizures ([place])   . Ventricular shunt in place     Allergies No Known Allergies  IV Location/Drains/Wounds Patient Lines/Drains/Airways Status   Active Line/Drains/Airways    None          Labs/Imaging Results for orders placed or performed during the hospital encounter of 04/26/18 (from the past 48 hour(s))  Basic metabolic panel     Status: Abnormal   Collection Time: 04/26/18  3:31 PM  Result Value Ref Range   Sodium 133 (L) 135 - 145 mmol/L   Potassium 4.3 3.5 - 5.1 mmol/L   Chloride 98 98 - 111 mmol/L   CO2 24 22 -  32 mmol/L   Glucose, Bld 105 (H) 70 - 99 mg/dL   BUN 15 6 - 20 mg/dL   Creatinine, Ser 1.05 0.61 - 1.24 mg/dL   Calcium 9.0 8.9 - 10.3 mg/dL   GFR calc non Af Amer >60 >60 mL/min   GFR calc Af Amer >60 >60 mL/min    Comment: (NOTE) The eGFR has been calculated using the CKD EPI equation. This calculation has not been validated in all clinical situations. eGFR's persistently <60 mL/min signify possible Chronic Kidney Disease.    Anion gap 11 5 - 15    Comment: Performed at [facility], [place] [address], [place], [place] [address]  CBC WITH DIFFERENTIAL     Status: None   Collection Time: 04/26/18  3:31 PM  Result Value Ref Range   WBC 6.5 4.0 - 10.5 K/uL   RBC 5.13 4.22 - 5.81 MIL/uL   Hemoglobin 14.8 13.0 - 17.0 g/dL   HCT 42.9 39.0 - 52.0 %   MCV 83.6 78.0 - 100.0 fL   MCH 28.8 26.0 - 34.0 pg   MCHC 34.5 30.0 - 36.0 g/dL   RDW 13.2 11.5 - 15.5 %   Platelets 233 150 - 400 K/uL   Neutrophils Relative % 69 %  Neutro Abs 4.5 1.7 - 7.7 K/uL   Lymphocytes Relative 24 %   Lymphs Abs 1.5 0.7 - 4.0 K/uL   Monocytes Relative 7 %   Monocytes Absolute 0.5 0.1 - 1.0 K/uL   Eosinophils Relative 0 %   Eosinophils Absolute 0.0 0.0 - 0.7 K/uL   Basophils Relative 0 %   Basophils Absolute 0.0 0.0 - 0.1 K/uL    Comment: Performed at [facility], [place] [address] [place], [place] [address]  CBG monitoring, ED     Status: Abnormal   Collection Time: 04/26/18  3:36 PM  Result Value Ref Range   Glucose-Capillary 123 (H) 70 - 99 mg/dL   Ct Head Wo Contrast  Result Date: 04/26/2018 CLINICAL DATA:  [place] from a wheelchair. Laceration to the posterior aspect of the head. EXAM: CT HEAD WITHOUT CONTRAST TECHNIQUE: Contiguous axial images were obtained from the base of the skull through the vertex without intravenous contrast. COMPARISON:  None. FINDINGS: Brain: There is an acute subdural hematoma on the left along the convexity with maximal thickness of 5-6 mm.  Mild mass effect with left-to-right midline shift of 3 mm. No evidence of intraparenchymal hemorrhage. 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 No suspicion of shunt malfunction. No sign of previous ischemic infarction or mass lesion. Vascular: There is atherosclerotic calcification of the major vessels at the base of the brain. Skull: No acute skull fracture. Sinuses/Orbits: Clear/normal Other: None IMPRESSION: Acute subdural hematoma along the convexity on the left, maximal thickness 5-6 mm. Mild mass effect with left-to-right shift of 3 mm. Previous VP shunt. Critical Value/emergent results were called by telephone at the time of interpretation on 04/26/2018 at 4:21 pm to [facility] , who verbally acknowledged these results. Electronically Signed   By: [name] M.D.   On: 04/26/2018 16:22    Pending Labs Unresulted Labs (From admission, onward)   None      Vitals/Pain Today's Vitals   04/26/18 1618 04/26/18 1700 04/26/18 1800 04/26/18 1804  BP:  135/90 138/82   Pulse:  62 (!) 56   Resp:  19 (!) 21   Temp:      TempSrc:      SpO2:  98% 96%   Weight:      Height:      PainSc: 0-No pain   0-No pain    Isolation Precautions No active isolations  Medications Medications  acetaminophen (TYLENOL) tablet 650 mg (has no administration in time range)  levETIRAcetam (KEPPRA) tablet 1,000 mg (has no administration in time range)  amLODipine (NORVASC) tablet 10 mg (has no administration in time range)  simvastatin (ZOCOR) tablet 10 mg (has no administration in time range)  tamsulosin (FLOMAX) capsule 0.4 mg (has no administration in time range)  traZODone (DESYREL) tablet 100 mg (has no administration in time range)  ondansetron (ZOFRAN) injection 4 mg (has no administration in time range)  Tdap (BOOSTRIX) injection 0.5 mL (0.5 mLs Intramuscular Given 04/26/18 1504)  acetaminophen (TYLENOL) tablet 1,000 mg (1,000 mg Oral Given 04/26/18  1659)    Mobility power wheelchair

## 2018-04-27 ENCOUNTER — Other Ambulatory Visit: Payer: Self-pay

## 2018-04-27 ENCOUNTER — Inpatient Hospital Stay (HOSPITAL_COMMUNITY): Payer: Medicaid Other

## 2018-04-27 DIAGNOSIS — W19XXXA Unspecified fall, initial encounter: Secondary | ICD-10-CM

## 2018-04-27 DIAGNOSIS — I10 Essential (primary) hypertension: Secondary | ICD-10-CM

## 2018-04-27 DIAGNOSIS — S065X9A Traumatic subdural hemorrhage with loss of consciousness of unspecified duration, initial encounter: Principal | ICD-10-CM

## 2018-04-27 DIAGNOSIS — Z982 Presence of cerebrospinal fluid drainage device: Secondary | ICD-10-CM

## 2018-04-27 DIAGNOSIS — Z993 Dependence on wheelchair: Secondary | ICD-10-CM

## 2018-04-27 DIAGNOSIS — Z8673 Personal history of transient ischemic attack (TIA), and cerebral infarction without residual deficits: Secondary | ICD-10-CM

## 2018-04-27 DIAGNOSIS — G8194 Hemiplegia, unspecified affecting left nondominant side: Secondary | ICD-10-CM

## 2018-04-27 DIAGNOSIS — I4510 Unspecified right bundle-branch block: Secondary | ICD-10-CM

## 2018-04-27 LAB — URINALYSIS, ROUTINE W REFLEX MICROSCOPIC
Bilirubin Urine: NEGATIVE
GLUCOSE, UA: NEGATIVE mg/dL
HGB URINE DIPSTICK: NEGATIVE
Ketones, ur: NEGATIVE mg/dL
LEUKOCYTES UA: NEGATIVE
Nitrite: NEGATIVE
PH: 5 (ref 5.0–8.0)
Protein, ur: NEGATIVE mg/dL
SPECIFIC GRAVITY, URINE: 1.017 (ref 1.005–1.030)

## 2018-04-27 MED ORDER — METOPROLOL TARTRATE 100 MG PO TABS: 50.0000 mg | ORAL_TABLET | Freq: Two times a day (BID) | ORAL | 0 refills | Status: DC

## 2018-04-27 MED ORDER — SODIUM CHLORIDE 0.9 % IV SOLN
INTRAVENOUS | Status: DC
  Administered 2018-04-27: 09:00:00 via INTRAVENOUS

## 2018-04-27 NOTE — Progress Notes (Signed)
Pt off the floor for CT scan.

## 2018-04-27 NOTE — Care Management Note (Addendum)
Case Management Note  Patient Details  Name: Wesley [name] L Priestly MRN: [id number] Date of Birth: [birth year]  Subjective/Objective:     Pt admitted with subdural hematoma. [facility]                Action/Plan: Awaiting PT/OT evals. CM following for d/c needs, physician orders.   Expected Discharge Date:  04/28/18               Expected Discharge Plan:  Assisted Living / Rest Home  In-House Referral:  Clinical Social Work  Discharge planning Services     Post Acute Care Choice:    Choice offered to:     DME Arranged:    DME Agency:     HH Arranged:    HH Agency:     Status of Service:  In process, will continue to follow  If discussed at Long Length of Stay Meetings, dates discussed:    Additional Comments:  1300: [facility]. [facility].   Kermit BaloKelli F Ourania Hamler, RN 04/27/2018, 12:12 PM

## 2018-04-27 NOTE — Clinical Social Work Note (Signed)
Clinical Social Work Assessment  Patient Details  Name: Wesley [name] MRN: [id number] Date of Birth: 01-01-1961  Date of referral:  04/27/18               Reason for consult:  Facility Placement                Permission sought to share information with:  Oceanographeracility Contact Representative Permission granted to share information::  Yes, Verbal Permission Granted  Name::        Agency::  [facility]  Relationship::     Contact Information:     Housing/Transportation Living arrangements for the past 2 months:  [facility] Source of Information:  Patient, Medical Team, Facility Patient Interpreter Needed:  None Criminal Activity/Legal Involvement Pertinent to Current Situation/Hospitalization:  No - Comment as needed Significant Relationships:  Siblings Lives with:  Self, Facility Resident Do you feel safe going back to the place where you live?  Yes Need for family participation in patient care:  No (Coment)  Care giving concerns:  Patient from ALF and has no concerns about care received.   Social Worker assessment / plan:  CSW alerted that patient is stable to return to ALF. CSW received concern from PT about patient needing assist with transfers. CSW [facility] to [facility], and they have already been assisting him with transfers, nurse reports that this is his baseline for mobility. The ALF is able to assist him with transfers. CSW asked about setting up home health; CSW to fax home health orders to ALF.   Employment status:  Disabled (Comment on whether or not currently receiving Disability) Insurance information:  [place] Recommendations:  Home with Home Health Information / Referral to community resources:     Patient/Family's Response to care:  Patient is ready to return home.  Patient/Family's Understanding of and Emotional Response to Diagnosis, Current Treatment, and Prognosis: Patient is ready to leave the hospital and return  home. Patient's facility reports that they have been caring for him and are able to take him back.  Emotional Assessment Appearance:  Appears stated age Attitude/Demeanor/Rapport:  Engaged Affect (typically observed):  Pleasant Orientation:  Oriented to Self, Oriented to Place, Oriented to  Time, Oriented to Situation Alcohol / Substance use:  Not Applicable Psych involvement (Current and /or in the community):  No (Comment)  Discharge Needs  Concerns to be addressed:  Care Coordination Readmission within the last 30 days:  No Current discharge risk:  Dependent with Mobility Barriers to Discharge:  No Barriers Identified   Baldemar Lenislizabeth M Kaari Zeigler, LCSW 04/27/2018, 12:54 PM

## 2018-04-27 NOTE — Progress Notes (Signed)
[facility] Anticipated discharge date: 04/27/18 Transportation by: [facility]  Report: [phone]  CSW signing off.  Blenda Nicelylizabeth Yashas Camilli LCSW [phone]

## 2018-04-27 NOTE — Progress Notes (Signed)
Pt returned from CT °

## 2018-04-27 NOTE — Progress Notes (Signed)
New CT head tonight shows trace subarachnoid hemorrhage in addition to the SDH that pt was admitted for. NP spoke with Dr. Lovell SheehanJenkins of [facility]. He actually saw the pt last night in consult. Stated there was no urgent matter.  KJKG, NP [facility]

## 2018-04-27 NOTE — Evaluation (Signed)
Physical Therapy Evaluation Patient Details Name: Wesley [name] MRN: [id number] DOB: 06-09-1961 Today's Date: 04/27/2018   History of Present Illness  Pt is a 57 y/o male [facility] Imaging revealed L subdural hematoma with trace subarachnoid hemorrhage within the L sylvian fissure and L parietal lobe. PMH includes L foot drop, CVA with residual L sided hemiparesis, and seizures.   Clinical Impression  Pt admitted secondary to problem above with deficits below. Pt presenting with L extremity tone/weakness at baseline, however, pt reports feeling increased weakness. Pt only able to perform partial standing with max A and LLE blocking this session. Educated about increased need for assist at d/c for transfers, and pt reports staff at ALF will be able to assist him [facility] Recommend HHPT/HHOT if staff able to physically assist pt as needed at ALF. If staff at ALF unable to provide increased assist at ALF, will need SNF at d/c. Notified CSW. Will continue to follow acutely to maximize functional mobility independence and safety.     Follow Up Recommendations Home health PT;Supervision/Assistance - 24 hour;Supervision for mobility/OOB(HHOT as long as staff at ALF can assist with transfers. )    Equipment Recommendations  Wheelchair (measurements PT)(needs new power WC )    Recommendations for Other Services OT consult     Precautions / Restrictions Precautions Precautions: Fall Precaution Comments: [facility] and it tipped on him.  Restrictions Weight Bearing Restrictions: No      Mobility  Bed Mobility Overal bed mobility: Needs Assistance Bed Mobility: Supine to Sit;Sit to Supine     Supine to sit: Mod assist Sit to supine: Min assist   General bed mobility comments: Mod A for trunk elevation and assist with LLE to come to sitting at EOB. Reliant on RUE support and min to min guard for balance  in sitting. Min A for LLE assist to return to supine.   Transfers Overall transfer level: Needs assistance Equipment used: 1 person hand held assist Transfers: Sit to/from Stand Sit to Stand: Max assist         General transfer comment: Max A for lift assist and steadying. Required manual blocking on LLE to maintain foot placement. Attempted to stand X 3 and only able to perform partial standing. Heavily reliant on RUE/RLE to stand.   Ambulation/Gait             General Gait Details: Uses WC at baseline   Stairs            Wheelchair Mobility    Modified Rankin (Stroke Patients Only)       Balance Overall balance assessment: Needs assistance Sitting-balance support: Single extremity supported;Feet supported Sitting balance-Leahy Scale: Poor Sitting balance - Comments: Reliant on RUE and external support to maintain sitting balance.    Standing balance support: Single extremity supported Standing balance-Leahy Scale: Poor Standing balance comment: Reliant on UE and external support for standing.                              Pertinent Vitals/Pain Pain Assessment: No/denies pain    Home Living Family/patient expects to be discharged to:: Assisted living               Home Equipment: Wheelchair - power;Wheelchair - manual Additional Comments: Reports he has been using manual WC as his power WC is broken.  Prior Function Level of Independence: Needs assistance   Gait / Transfers Assistance Needed: Reports he was independent [facility]   ADL's / Homemaking Assistance Needed: Reports he needed assistance with ADL tasks.         Hand Dominance        Extremity/Trunk Assessment   Upper Extremity Assessment Upper Extremity Assessment: LUE deficits/detail LUE Deficits / Details: LUE extensor tone. Also noted spasms in LUE as well. Pt reports no AROM at baseline.    Lower Extremity Assessment Lower Extremity Assessment:  LLE deficits/detail LLE Deficits / Details: LLE extensor tone noted. Noted spasms in LLE as well. Pt reports no AROM at baseline.    Cervical / Trunk Assessment Cervical / Trunk Assessment: Normal  Communication   Communication: No difficulties  Cognition Arousal/Alertness: Awake/alert Behavior During Therapy: WFL for tasks assessed/performed Overall Cognitive Status: Within Functional Limits for tasks assessed                                        General Comments General comments (skin integrity, edema, etc.): Pt reports staff at ALF will be able to assist him with transfers. Notified CSW. Pt reports power WC should be fixed by tomorrow.     Exercises     Assessment/Plan    PT Assessment Patient needs continued PT services  PT Problem List Decreased strength;Decreased range of motion;Decreased balance;Decreased mobility;Decreased knowledge of use of DME;Decreased knowledge of precautions       PT Treatment Interventions DME instruction;Therapeutic activities;Functional mobility training;Therapeutic exercise;Balance training;Neuromuscular re-education;Wheelchair mobility training    PT Goals (Current goals can be found in the Care Plan section)  Acute Rehab PT Goals Patient Stated Goal: to go back to ALF  PT Goal Formulation: With patient Time For Goal Achievement: 05/11/18 Potential to Achieve Goals: Fair    Frequency Min 3X/week   Barriers to discharge        Co-evaluation               AM-PAC PT "6 Clicks" Daily Activity  Outcome Measure Difficulty turning over in bed (including adjusting bedclothes, sheets and blankets)?: A Lot Difficulty moving from lying on back to sitting on the side of the bed? : Unable Difficulty sitting down on and standing up from a chair with arms (e.g., wheelchair, bedside commode, etc,.)?: Unable Help needed moving to and from a bed to chair (including a wheelchair)?: Total Help needed walking in hospital room?:  Total Help needed climbing 3-5 steps with a railing? : Total 6 Click Score: 7    End of Session Equipment Utilized During Treatment: Gait belt Activity Tolerance: Patient tolerated treatment well Patient left: in bed;with call bell/phone within reach;with bed alarm set Nurse Communication: Mobility status PT Visit Diagnosis: Unsteadiness on feet (R26.81);Hemiplegia and hemiparesis;Difficulty in walking, not elsewhere classified (R26.2);Muscle weakness (generalized) (M62.81) Hemiplegia - Right/Left: Left Hemiplegia - dominant/non-dominant: Non-dominant Hemiplegia - caused by: Cerebral infarction    Time: 4401-02721119-1139 PT Time Calculation (min) (ACUTE ONLY): 20 min   Charges:   PT Evaluation $PT Eval Moderate Complexity: 1 Mod          Gladys DammeBrittany Zailee Vallely, PT, DPT  Acute Rehabilitation Services  Pager: [phone]   Lehman PromBrittany S Juanna Pudlo 04/27/2018, 12:44 PM

## 2018-04-27 NOTE — Progress Notes (Signed)
Subjective: The patient is alert and pleasant.  He looks well.  He has no complaints.  He wants to go home.  Objective: Vital signs in last 24 hours: Temp:  [98.5 F (36.9 C)-99.1 F (37.3 C)] 98.5 F (36.9 C) (08/13 0721) Pulse Rate:  [53-62] 57 (08/13 0721) Resp:  [16-22] 17 (08/13 0721) BP: (85-138)/(46-90) 85/46 (08/13 0721) SpO2:  [96 %-99 %] 98 % (08/13 0721) Weight:  [117.4 kg-117.9 kg] 117.4 kg (08/12 1957) Estimated body mass index is 38.22 kg/m as calculated from the following:   Height as of this encounter: 5\' 9"  (1.753 m).   Weight as of this encounter: 117.4 kg.   Intake/Output from previous day: 08/12 0701 - 08/13 0700 In: -  Out: 450 [Urine:450] Intake/Output this shift: No intake/output data recorded.  Physical exam patient is alert and oriented x3.  He is left hemiparetic without change.  I have reviewed the patient's follow-up head CT performed this morning.  The subdural hematoma has not changed differently.  There is mild mass-effect.  There is a tiny amount of subarachnoid blood.  Lab Results: Recent Labs    04/26/18 1531  WBC 6.5  HGB 14.8  HCT 42.9  PLT 233   BMET Recent Labs    04/26/18 1531  NA 133*  K 4.3  CL 98  CO2 24  GLUCOSE 105*  BUN 15  CREATININE 1.05  CALCIUM 9.0    Studies/Results: Ct Head Wo Contrast  Addendum Date: 04/27/2018   ADDENDUM REPORT: 04/27/2018 01:31 ADDENDUM: Subsequent submitted coronal reconstruction images demonstrates a trace volume of subarachnoid hemorrhage within the left sylvian fissure and within a sulcus of the left parietal lobe which is new from the prior study (series 7, image 46). These results will be called to the ordering clinician or representative by the Radiologist Assistant, and communication documented in the PACS or zVision Dashboard. Electronically Signed   By: Mitzi HansenLance  Furusawa-Stratton M.D.   On: 04/27/2018 01:31   Result Date: 04/27/2018 CLINICAL DATA:  57 y/o M; fall with subdural  hematoma for follow-up. EXAM: CT HEAD WITHOUT CONTRAST TECHNIQUE: Contiguous axial images were obtained from the base of the skull through the vertex without intravenous contrast. COMPARISON:  04/26/2018 CT head. FINDINGS: Brain: Stable 5 mm subdural hematoma over the left cerebral convexity. Stable mass effect with 3 mm of left-to-right midline shift. Stable right parietal approach ventriculostomy catheter with tip in the posterior aspect of right lateral ventricle. No new acute intracranial hemorrhage, stroke, or focal mass effect. Vascular: Calcific atherosclerosis of carotid siphons. No hyperdense vessel. Skull: Chronic postsurgical changes related to a right parietal burr hole. No acute osseous abnormality. Sinuses/Orbits: No acute finding. Other: None. IMPRESSION: Stable 5 mm left cerebral convexity subdural hematoma with 3 mm of left-to-right midline shift. No new acute intracranial abnormality. Electronically Signed: By: Mitzi HansenLance  Furusawa-Stratton M.D. On: 04/27/2018 01:06   Ct Head Wo Contrast  Result Date: 04/26/2018 CLINICAL DATA:  Larey SeatFell from a wheelchair. Laceration to the posterior aspect of the head. EXAM: CT HEAD WITHOUT CONTRAST TECHNIQUE: Contiguous axial images were obtained from the base of the skull through the vertex without intravenous contrast. COMPARISON:  None. FINDINGS: Brain: There is an acute subdural hematoma on the left along the convexity with maximal thickness of 5-6 mm. Mild mass effect with left-to-right midline shift of 3 mm. No evidence of intraparenchymal hemorrhage. 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 No suspicion of shunt malfunction. No  sign of previous ischemic infarction or mass lesion. Vascular: There is atherosclerotic calcification of the major vessels at the base of the brain. Skull: No acute skull fracture. Sinuses/Orbits: Clear/normal Other: None IMPRESSION: Acute subdural hematoma along the convexity on the left,  maximal thickness 5-6 mm. Mild mass effect with left-to-right shift of 3 mm. Previous VP shunt. Critical Value/emergent results were called by telephone at the time of interpretation on 04/26/2018 at 4:21 pm to [facility] , who verbally acknowledged these results. Electronically Signed   By: Paulina FusiMark  Shogry M.D.   On: 04/26/2018 16:22    Assessment/Plan: Traumatic subdural hematoma subarachnoid hemorrhage: The patient is doing well clinically.  His scan has not changed significantly overnight.  He is okay for discharge home from my point of view.  Please have him follow-up with me in the office in a few weeks.  LOS: 1 day     Cristi LoronJeffrey D Latravis Grine 04/27/2018, 7:51 AM

## 2018-04-27 NOTE — Progress Notes (Signed)
[facility]. Discharge information given to the patient and copy of AVS sent with EMS to facility. Patient questions asked and answered. Lawson RadarHeather M Maalik Pinn

## 2018-04-27 NOTE — Discharge Summary (Addendum)
Discharge Summary  Wesley [name] XBJ:[id number] DOB: 28-Jun-1961  PCP: Jannett Celestine, PA-C  Admit date: 04/26/2018 Discharge date: 04/27/2018  Time spent: , more than 50% time spent on care coordination.  Recommendations for Outpatient Follow-up:  1. F/u with PMD within a week  for hospital discharge follow up, repeat cbc/bmp at follow up 2. F/u with neurosurgery Dr Lovell Sheehan in two weeks 3. F/u with neurology Dr Anne Hahn as already scheduled 4. F/u with cardiology for RBBB on EKG and 30day event monitor ( I sent a message to cardsmaster) 5. Home health arranged  Discharge Diagnoses:  Active Hospital Problems   Diagnosis Date Noted  . Subdural hematoma (HCC) 04/26/2018    Resolved Hospital Problems  No resolved problems to display.    Discharge Condition: stable  Diet recommendation: heart healthy  Filed Weights   04/26/18 1339 04/26/18 1957  Weight: 117.9 kg 117.4 kg    History of present illness: (per admitting MD Dr Margo Aye) Patient coming from: ALF [facility]  Chief Complaint: Unwitnessed fall at ALF  HPI: Wesley [name] is a 57 y.o. male with medical history significant for seizure disorder, previous intracranial hemorrhage with left hemiparesis, hypertension, hyperlipidemia, BPH, chronic depression/anxiety who presented to [facility] ED after an unwitnessed fall from his wheelchair at his assisted living facility earlier today.  Patient is alert and oriented x3.  However he has no recollection of the event that led to his fall.  States that he has a habit of falling asleep in his wheelchair.  Denies history of OSA.  Denies tongue biting, urinary, or fecal incontinence.  Does not appear postictal.  States he was in his normal state of health prior to this.  Denies any cardiac or pulmonary symptoms. Admits to mild headache at laceration site.  ED Course: Vital signs remarkable for fever with T-max 101.3.  CT head with no contrast revealed acute subdural hematoma.  TRH  asked to admit.   Hospital Course:  Active Problems:   Subdural hematoma (HCC)  Acute subdural hematoma status post fall -per ALS staff "She states that his fall was unwitnessed but they did hear him fall and ran immediately to check on him said that he was awake and alert but could not tell them what happened however she thinks that he miscalculated when trying to self transfer and had a mechanical fall" - fall is likely a mechanical fall, I did asked the patient twice about what has happened.  he told me he recently changed from a powered wheelchair to a mechanical one, he was trying to get up and use the urinal and fell backwards, he denies LOC, he denies chest pain, palpitation, dizziness prior to the event.  He denies seizure event. -EKG with RBBB, tele sinus rhythm, he denies chest pain, no sob, I have referred patient to cardiology for 30day event monitor and cardiology follow up. -ua unremarkable, no leukocytosis, no fever in the hospital, he does not appear septic, lung clear, no hypoxia -orthostatic vital sign unremarkable -CT head x2 stable, he reports back to baseline today -neurosurgery cleared him to discharge back to ALF with close follow up  a slight laceration to the posterior skull.   S/p laceration repair in the ED S/p tetanus vaccination in the ED  Mild hyponatremia  sodium 133 on presentation He received gentle hydration, Patient is encouraged to have adequate hydration. pcp to repeat bmp at follow up.   previous h/o intracranial hemorrhage with left-sided hemiparesis, status post VP shunt  Wheelchair  bound, he reports able to getup from wheelchair by himself, able to stand up using a walker briefly at baseline Followed by neurology regularly  H/o Seizure Stable on keppra, no recent seizures   HTN: His blood pressure is low normal, he reports his blood pressure has been running low. Will d/c norvasc He is discharged on reduced dose of  betablocker He is to  have blood pressure checked , further blood pressure management per pcp   Procedures:  none  Consultations:  neurosrugery  Discharge Exam: BP 113/72 (BP Location: Left Arm)   Pulse 71   Temp 98.7 F (37.1 C) (Oral)   Resp 18   Ht 5\' 9"  (1.753 m)   Wt 117.4 kg   SpO2 98%   BMI 38.22 kg/m   General: NAD Cardiovascular: RRR Respiratory: CTABL Neuro: chronic left hemiparesis   Discharge Instructions You were cared for by a hospitalist during your hospital stay. If you have any questions about your discharge medications or the care you received while you were in the hospital after you are discharged, you can call the unit and asked to speak with the hospitalist on call if the hospitalist that took care of you is not available. Once you are discharged, your primary care physician will handle any further medical issues. Please note that NO REFILLS for any discharge medications will be authorized once you are discharged, as it is imperative that you return to your primary care physician (or establish a relationship with a primary care physician if you do not have one) for your aftercare needs so that they can reassess your need for medications and monitor your lab values.  Discharge Instructions    Diet - low sodium heart healthy   Complete by:  As directed    Increase activity slowly   Complete by:  As directed      Allergies as of 04/27/2018   No Known Allergies     Medication List    STOP taking these medications   amLODipine 10 MG tablet Commonly known as:  NORVASC   loperamide 2 MG capsule Commonly known as:  IMODIUM   methocarbamol 500 MG tablet Commonly known as:  ROBAXIN   oxyCODONE-acetaminophen 5-325 MG tablet Commonly known as:  PERCOCET/ROXICET   TOVIAZ 4 MG Tb24 tablet Generic drug:  fesoterodine   zolpidem 5 MG tablet Commonly known as:  AMBIEN     TAKE these medications   acetaminophen 500 MG tablet Commonly known as:  TYLENOL Take 500 mg by  mouth every 4 (four) hours as needed for fever or headache.   alum & mag hydroxide-simeth 400-400-40 MG/5ML suspension Commonly known as:  MAALOX PLUS Take 15 mLs by mouth as needed for indigestion.   baclofen 20 MG tablet Commonly known as:  LIORESAL Take 1 tablet (20 mg total) by mouth 3 (three) times daily.   bisacodyl 5 MG EC tablet Commonly known as:  DULCOLAX Take 5 mg by mouth every other day.   guaifenesin 100 MG/5ML syrup Commonly known as:  ROBITUSSIN Take 200 mg by mouth every 6 (six) hours as needed for cough.   ibuprofen 800 MG tablet Commonly known as:  ADVIL,MOTRIN Take 800 mg by mouth every 6 (six) hours as needed (pain).   magnesium citrate Soln Take 1 Bottle by mouth See admin instructions. Take every 2 weeks as needed for severe constipation   magnesium hydroxide 400 MG/5ML suspension Commonly known as:  MILK OF MAGNESIA Take 30 mLs by mouth at bedtime  as needed for mild constipation.   Melatonin 5 MG Tabs Take 5 mg by mouth every evening.   metoprolol tartrate 100 MG tablet Commonly known as:  LOPRESSOR Take 0.5 tablets (50 mg total) by mouth 2 (two) times daily. Hold lopressor if sbp less than 90. What changed:    how much to take  additional instructions   multivitamin tablet Take 1 tablet by mouth daily.   neomycin-bacitracin-polymyxin [phone] ointment Apply 1 application topically as needed (skin tears).   polyethylene glycol packet Commonly known as:  MIRALAX / GLYCOLAX Take 17 g by mouth daily as needed for mild constipation.   senna-docusate 8.6-50 MG tablet Commonly known as:  Senokot-S Take 1 tablet by mouth See admin instructions. Take 1 tablet by mouth three times a week as needed for constipation   simvastatin 10 MG tablet Commonly known as:  ZOCOR Take 10 mg by mouth daily.   tamsulosin 0.4 MG Caps capsule Commonly known as:  FLOMAX Take 0.4 mg by mouth at bedtime.   traZODone 100 MG tablet Commonly known as:   DESYREL Take 100 mg by mouth every evening.      No Known Allergies Follow-up Information    Tamala BariCaffey, Clydie BraunKaren, PA-C Follow up in 1 week(s).   Specialty:  Physician Assistant Why:  hospital discharge follow up, please check your blood pressure dialy and bring in record for your doctor to review. pcp to further adjust bp med. repeat cbc/bmp at follow up Contact information: [address] [place] [address] [phone]        Tressie StalkerJenkins, Jeffrey, MD Follow up in 2 week(s).   Specialty:  Neurosurgery Why:  Traumatic subdural hematoma subarachnoid hemorrhage Contact information: [address] [place] [address] [phone]        follow up with cardiology for RBBB on EKG Follow up in 4 week(s).   Why:  and 30day event monitor            The results of significant diagnostics from this hospitalization (including imaging, microbiology, ancillary and laboratory) are listed below for reference.    Significant Diagnostic Studies: Ct Head Wo Contrast  Addendum Date: 04/27/2018   ADDENDUM REPORT: 04/27/2018 01:31 ADDENDUM: Subsequent submitted coronal reconstruction images demonstrates a trace volume of subarachnoid hemorrhage within the left sylvian fissure and within a sulcus of the left parietal lobe which is new from the prior study (series 7, image 46). These results will be called to the ordering clinician or representative by the Radiologist Assistant, and communication documented in the PACS or zVision Dashboard. Electronically Signed   By: Mitzi HansenLance  Furusawa-Stratton M.D.   On: 04/27/2018 01:31   Result Date: 04/27/2018 CLINICAL DATA:  57 y/o M; fall with subdural hematoma for follow-up. EXAM: CT HEAD WITHOUT CONTRAST TECHNIQUE: Contiguous axial images were obtained from the base of the skull through the vertex without intravenous contrast. COMPARISON:  04/26/2018 CT head. FINDINGS: Brain: Stable 5 mm subdural hematoma over the left cerebral convexity.  Stable mass effect with 3 mm of left-to-right midline shift. Stable right parietal approach ventriculostomy catheter with tip in the posterior aspect of right lateral ventricle. No new acute intracranial hemorrhage, stroke, or focal mass effect. Vascular: Calcific atherosclerosis of carotid siphons. No hyperdense vessel. Skull: Chronic postsurgical changes related to a right parietal burr hole. No acute osseous abnormality. Sinuses/Orbits: No acute finding. Other: None. IMPRESSION: Stable 5 mm left cerebral convexity subdural hematoma with 3 mm of left-to-right midline shift. No new acute intracranial  abnormality. Electronically Signed: By: Mitzi Hansen M.D. On: 04/27/2018 01:06   Ct Head Wo Contrast  Result Date: 04/26/2018 CLINICAL DATA:  Larey Seat from a wheelchair. Laceration to the posterior aspect of the head. EXAM: CT HEAD WITHOUT CONTRAST TECHNIQUE: Contiguous axial images were obtained from the base of the skull through the vertex without intravenous contrast. COMPARISON:  None. FINDINGS: Brain: There is an acute subdural hematoma on the left along the convexity with maximal thickness of 5-6 mm. Mild mass effect with left-to-right midline shift of 3 mm. No evidence of intraparenchymal hemorrhage. 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 No suspicion of shunt malfunction. No sign of previous ischemic infarction or mass lesion. Vascular: There is atherosclerotic calcification of the major vessels at the base of the brain. Skull: No acute skull fracture. Sinuses/Orbits: Clear/normal Other: None IMPRESSION: Acute subdural hematoma along the convexity on the left, maximal thickness 5-6 mm. Mild mass effect with left-to-right shift of 3 mm. Previous VP shunt. Critical Value/emergent results were called by telephone at the time of interpretation on 04/26/2018 at 4:21 pm to [facility] , who verbally acknowledged these results. Electronically Signed    By: Paulina Fusi M.D.   On: 04/26/2018 16:22    Microbiology: No results found for this or any previous visit (from the past 240 hour(s)).   Labs: Basic Metabolic Panel: Recent Labs  Lab 04/26/18 1531  NA 133*  K 4.3  CL 98  CO2 24  GLUCOSE 105*  BUN 15  CREATININE 1.05  CALCIUM 9.0   Liver Function Tests: No results for input(s): AST, ALT, ALKPHOS, BILITOT, PROT, ALBUMIN in the last 168 hours. No results for input(s): LIPASE, AMYLASE in the last 168 hours. No results for input(s): AMMONIA in the last 168 hours. CBC: Recent Labs  Lab 04/26/18 1531  WBC 6.5  NEUTROABS 4.5  HGB 14.8  HCT 42.9  MCV 83.6  PLT 233   Cardiac Enzymes: No results for input(s): CKTOTAL, CKMB, CKMBINDEX, TROPONINI in the last 168 hours. BNP: BNP (last 3 results) No results for input(s): BNP in the last 8760 hours.  ProBNP (last 3 results) No results for input(s): PROBNP in the last 8760 hours.  CBG: Recent Labs  Lab 04/26/18 1536  GLUCAP 123*       Signed:  Albertine Grates MD, PhD  [facility] 04/27/2018, 1:43 PM

## 2018-04-27 NOTE — [place] FL2 (Signed)
[place] MEDICAID FL2 LEVEL OF CARE SCREENING TOOL     IDENTIFICATION  Patient Name: Wesley [name] Birthdate: 11-Apr-1961 Sex: male Admission Date (Current Location): 04/26/2018  [facility]   Facility and Address:  [facility], [address], [place], [place] [address]      Provider Number: [id number]  Attending Physician Name and Address:  Albertine GratesXu, Fang, MD  Relative Name and Phone Number:       Current Level of Care: Hospital Recommended Level of Care: Assisted Living Facility Prior Approval Number:    Date Approved/Denied:   PASRR Number:    Discharge Plan: Other (Comment)(ALF)    Current Diagnoses: Patient Active Problem List   Diagnosis Date Noted  . Subdural hematoma (HCC) 04/26/2018  . Foot drop, left 08/21/2014  . 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 Abnormality of gait 04/18/2014    Orientation RESPIRATION BLADDER Height & Weight     Self, Time, Situation, Place  Normal Incontinent Weight: 258 lb 13.1 oz (117.4 kg) Height:  5\' 9"  (175.3 cm)  BEHAVIORAL SYMPTOMS/MOOD NEUROLOGICAL BOWEL NUTRITION STATUS      Continent Diet(no added salt)  AMBULATORY STATUS COMMUNICATION OF NEEDS Skin   Extensive Assist Verbally Normal                       Personal Care Assistance Level of Assistance  Bathing, Feeding, Dressing Bathing Assistance: Limited assistance Feeding assistance: Independent Dressing Assistance: Limited assistance     Functional Limitations Info  Sight, Hearing, Speech Sight Info: Adequate Hearing Info: Adequate Speech Info: Adequate    SPECIAL CARE FACTORS FREQUENCY  PT (By licensed PT), OT (By licensed OT)     PT Frequency: 3x/wk with home health OT Frequency: 3x/wk with home health            Contractures Contractures Info: Not present    Additional Factors Info  Code Status, Allergies Code Status Info:  Full Allergies Info: NKA           Current Medications (04/27/2018):  This is the current hospital active medication list Current Facility-Administered Medications  Medication Dose Route Frequency Provider Last Rate Last Dose  . 0.9 %  sodium chloride infusion   Intravenous Continuous Albertine GratesXu, Fang, MD 75 mL/hr at 04/27/18 [phone]    . acetaminophen (TYLENOL) tablet 650 mg  650 mg Oral Q6H PRN Dow AdolphHall, Carole N, DO      . hydrALAZINE (APRESOLINE) injection 10 mg  10 mg Intravenous Q6H PRN Dow AdolphHall, Carole N, DO      . levETIRAcetam (KEPPRA) tablet 1,000 mg  1,000 mg Oral BID Dow AdolphHall, Carole N, DO   1,000 mg at 04/27/18 0915  . ondansetron (ZOFRAN) injection 4 mg  4 mg Intravenous Q6H PRN Dow AdolphHall, Carole N, DO      . simvastatin (ZOCOR) tablet 10 mg  10 mg Oral Daily Dow AdolphHall, Carole N, DO   10 mg at 04/27/18 0915  . tamsulosin (FLOMAX) capsule 0.8 mg  0.8 mg Oral QPC supper Dow AdolphHall, Carole N, DO   0.8 mg at 04/26/18 2205  . traZODone (DESYREL) tablet 100 mg  100 mg Oral QHS Dow AdolphHall, Carole N, DO   100 mg at 04/26/18 2204     Discharge Medications: Please see discharge summary for a list of discharge medications.  Relevant Imaging Results:  Relevant Lab Results:   Additional Information SS#: [id number]  Baldemar Lenislizabeth M Jameel Quant,  LCSW

## 2018-04-28 ENCOUNTER — Other Ambulatory Visit: Payer: Self-pay | Admitting: Cardiology

## 2018-04-28 DIAGNOSIS — R55 Syncope and collapse: Secondary | ICD-10-CM

## 2018-05-21 ENCOUNTER — Encounter: Payer: Self-pay | Admitting: Cardiology

## 2018-06-04 ENCOUNTER — Ambulatory Visit: Payer: Medicaid Other | Admitting: Cardiology

## 2018-06-14 ENCOUNTER — Other Ambulatory Visit: Payer: Self-pay | Admitting: Cardiology

## 2018-06-14 DIAGNOSIS — I4510 Unspecified right bundle-branch block: Secondary | ICD-10-CM

## 2018-06-14 DIAGNOSIS — R55 Syncope and collapse: Secondary | ICD-10-CM

## 2018-06-24 ENCOUNTER — Telehealth: Payer: Self-pay | Admitting: Neurology

## 2018-06-24 ENCOUNTER — Ambulatory Visit: Payer: Medicaid Other | Admitting: Neurology

## 2018-06-24 ENCOUNTER — Encounter: Payer: Self-pay | Admitting: Neurology

## 2018-06-24 NOTE — Telephone Encounter (Signed)
This patient did not show for a revisit appointment today. 

## 2018-06-28 ENCOUNTER — Other Ambulatory Visit: Payer: Self-pay

## 2018-06-28 ENCOUNTER — Ambulatory Visit: Payer: Medicaid Other | Attending: Physician Assistant | Admitting: Physical Therapy

## 2018-06-28 DIAGNOSIS — R208 Other disturbances of skin sensation: Secondary | ICD-10-CM | POA: Diagnosis present

## 2018-06-28 DIAGNOSIS — M6281 Muscle weakness (generalized): Secondary | ICD-10-CM | POA: Diagnosis present

## 2018-06-28 DIAGNOSIS — R296 Repeated falls: Secondary | ICD-10-CM | POA: Insufficient documentation

## 2018-06-28 DIAGNOSIS — I69354 Hemiplegia and hemiparesis following cerebral infarction affecting left non-dominant side: Secondary | ICD-10-CM | POA: Diagnosis present

## 2018-06-28 DIAGNOSIS — R293 Abnormal posture: Secondary | ICD-10-CM | POA: Diagnosis present

## 2018-06-28 DIAGNOSIS — R2681 Unsteadiness on feet: Secondary | ICD-10-CM | POA: Insufficient documentation

## 2018-06-28 DIAGNOSIS — R29818 Other symptoms and signs involving the nervous system: Secondary | ICD-10-CM | POA: Diagnosis present

## 2018-06-28 NOTE — Therapy (Signed)
[facility] [address] [place], [place], [address] Phone: [phone]   Fax:  [phone]  Physical Therapy Evaluation  Patient Details  Name: Wesley [name] MRN: [id number] Date of Birth: 07/12/1961 Referring Provider (PT): Jeanette Caprice, MD   Encounter Date: 06/28/2018  PT End of Session - 06/28/18 1509    Visit Number  1    Number of Visits  1   power w/c eval only   Authorization Type  Medicaid    PT Start Time  1316    PT Stop Time  1430    PT Time Calculation (min)  74 min    Activity Tolerance  Patient tolerated treatment well    Behavior During Therapy  [facility] for tasks assessed/performed       Past Medical History:  Diagnosis Date  . Abnormality of gait 04/18/2014  . BPH (benign prostatic hyperplasia)   . 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 Chronic renal insufficiency   . Depression   . Dyslipidemia   . Foot drop, left 08/21/2014  . Gait disorder   . GERD (gastroesophageal reflux disease)   . Hemiparesis affecting left side as late effect of cerebrovascular accident ([place]) 04/18/2014  . Seizures ([place])   . Ventricular shunt in place     Past Surgical History:  Procedure Laterality Date  . athroscopic surgery knee Left   . CAROTID ENDARTERECTOMY    . umilical hernia repair      There were no vitals filed for this visit.   Subjective Assessment - 06/28/18 1503    Subjective  Pt presents for power w/c evaluation.  Also present is Deberah Pelton from [employer]. Pt's current power w/c is 59 years old and has required extensive repairs.  Motor currently is not functioning and pt arrives in [place] wheelchair.    Pertinent History  hemiplegia affecting left side following brainstem CVA in 2010, h/o falls with subdural hematoma, left foot drop, BPH, chronic renal insufficiency, depression, dyslipidemia, seizures, carotid endarterectomy and ventricular shunt placement    Patient Stated Goals  to obtain a  new power wheelchair to maintain independent mobility    Currently in Pain?  No/denies         [facility] PT Assessment - 06/28/18 1505      Assessment   Medical Diagnosis  L hemiplegia; power wheelchair evaluation    Referring Provider (PT)  Jeanette Caprice, MD    Onset Date/Surgical Date  03/24/18   date of order   Hand Dominance  Right      Precautions   Precautions  [facility] living      Prior Function   Level of Independence  Needs assistance with ADLs;Needs assistance with transfers;Independent with household mobility with device;Independent with community mobility with device   requires wheelchair for mobility   Vocation  Part time employment        Mobility/Seating Evaluation    PATIENT INFORMATION: Name: Wesley [name] DOB: [birth year]  Sex: Male Date seen: 06/28/2018 Time: 1:15  Address:  [address] [place], [place] [address] Physician: PCP: Jeanette Caprice PA-C This evaluation/justification form will serve as the LMN for the following suppliers: __________________________ Supplier: [employer] Contact Person: Deberah Pelton Phone:  [phone]   Seating Therapist: Misty Stanley, PT Phone:   [phone]   Phone: [phone]    Spouse/Parent/Caregiver name: Brother: Shelba Flake   Phone number: [phone] Insurance/Payer:  Medicaid     Reason for Referral: To obtain a power wheelchair  Patient/Caregiver Goals: To obtain a new power wheelchair to maintain independence with mobility in the home, at work and school  Patient was seen for face-to-face evaluation for new power wheelchair.  Also present was Deberah Pelton, [employer] to discuss recommendations and wheelchair options.  Further paperwork was completed and sent to vendor.  Patient appears to qualify for power mobility device at this time per objective findings.   MEDICAL HISTORY: Diagnosis: Primary Diagnosis: Hemiplegia affecting left side as  late effect of cerebrovascular accident Onset: May 2010 Diagnosis: Subdural hematoma   [] Progressive Disease Relevant past and future surgeries: carotid endarterectomy   Height: 5' 11"  Weight: 252 Explain recent changes or trends in weight: weight fluctuations due to renal insufficiency 252 - 258   History including Falls: hemiplegia affecting left side following brainstem CVA in 2010, h/o falls with subdural hematoma, left foot drop, BPH, chronic renal insufficiency, depression, dyslipidemia, seizures, carotid endarterectomy and ventricular shunt placement    HOME ENVIRONMENT: [] House  [] Condo/town home  [] Apartment  [x] Assisted Living    [] Lives Alone [x]  Lives with Others                                                                                          Hours with caregiver: 24/7; assistance with walking into shower, getting dressed  [x] Home is accessible to patient           Stairs      [] Yes [x]  No     Ramp [] Yes [x] No Comments:  Assisted living with level entry and all rooms on one level; pt has private room but shares a bathroom with a roommate   COMMUNITY ADL: TRANSPORTATION: [] Car    [] Van    [x] Public Transportation    [] Adapted w/c Lift    [] Ambulance    [] Other:       [x] Sits in wheelchair during transport  Employment/School: Works at [employer], sports on Saturday and Sunday - 9am-5pm, school at Tuesday (for [employer]) from 8-3pm Specific requirements pertaining to mobility Has to be independent with getting around campus at [facility] and must be independent with mobility at work as [employer]  Other: 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  Functional Processing Skills for Wheeled Mobility [x] Processing Skills are adequate for safe wheelchair operation  Areas of concern than may interfere with safe operation of wheelchair Description of problem   []  Attention to environment       [] Judgment      []  Hearing  []  Vision or visual processing      [] Motor Planning  []  Fluctuations in Behavior  ?????    VERBAL COMMUNICATION: [x] WFL receptive [x]  WFL expressive [] Understandable  [] Difficult to understand  [] non-communicative []  Uses an augmented communication device  CURRENT SEATING / MOBILITY: Current Mobility Base:  [] None [] Dependent [x] Manual [] Scooter [x] Power  Type of Control: joystick  Manufacturer:  [employer] Pronto M51 Size:  unknown Age: 38 years; 2013  Current Condition  of Mobility Base:  Has had to have multiple extensive repairs over the past few years, motor is currently not working properly so pt is in manual wheelchair today.  Fair to poor condition   Current Wheelchair components:  Unknown but pt does report current power wheelchair does not have elevating leg rests, power tilt or recline to assist with edema management and pressure relief.  Manual w/c has basic sling back and seat with basic foam for cushioning, Left ELR, removable arm rests  Describe posture in present seating system:  due to extensor tone pt's LLE pushes into extension and does not rest on foot rest; heel rests on edge of foot plate and LLE externally rotates pressing into side of wheelchair and leg rest; posterior pelvic tilt, sits with weight shifted over R pelvis      SENSATION and SKIN ISSUES: Sensation [] Intact  [x] Impaired [] Absent  Level of sensation: Decreased sensation to light touch on L side UE and LE Pressure Relief: Able to perform effective pressure relief :    [] Yes  [x]  No Method: ???? If not, Why?: Has to have something sturdy to pull to stand with RUE; can stand for a few minutes; fall risk if not holding on with RUE  Skin Issues/Skin Integrity Current Skin Issues  [] Yes [x] No [] Intact []  Red area[]  Open Area  [] Scar Tissue [x] At risk from prolonged sitting Where  ?????  History of Skin Issues  [] Yes [x] No Where  ????? When  ?????  Hx of skin flap surgeries  [] Yes  [x] No Where  ????? When  ?????  Limited sitting tolerance [] Yes [x] No Hours spent sitting in wheelchair daily: 8 hours  Complaint of Pain:  Please describe: No   Swelling/Edema: Bilat LE edema, worse in dependent position and does not have a way to elevate LE during prolonged sitting    ADL STATUS (in reference to wheelchair use):  Indep Assist Unable Indep with Equip Not assessed Comments  Dressing ????? X ????? ????? ????? ?????  Eating ????? X ????? ????? ????? sitting in w/c; has to have assistance to cut up meats  Toileting ????? X ????? ????? ????? assist for transfers and hygiene  Bathing ????? X ????? ????? ????? assist for transfers   Grooming/Hygiene ????? ????? ????? X ????? sitting in w/c  Meal Prep ????? ????? X ????? ????? eats meals in dining room  IADLS X ????? ????? ????? ????? ?????  Bowel Management: [x] Continent  [] Incontinent  [] Accidents Comments:  ?????  Bladder Management: [x] Continent  [] Incontinent  [] Accidents Comments:  toilet and urinal at night -standing EOB      WHEELCHAIR SKILLS: Manual w/c Propulsion: [] UE or LE strength and endurance sufficient to participate in ADLs using manual wheelchair Arm : [] left [x] right   [] Both      Distance: short distances over level surfaces indoors; room <> dining hall ~100 yards Foot:  [] left [x] right   [] Both  Operate Scooter: []  Strength, hand grip, balance and transfer appropriate for use [] Living environment is accessible for use of scooter  Operate Power w/c:  [x]  Std. Joystick   []  Alternative Controls Indep [x]  Assist []  Dependent/unable []  N/A []   [x] Safe          [x]  Functional      Distance: community  Bed confined without wheelchair [x]  Yes []  No   STRENGTH/RANGE OF MOTION:  A/PROM Range of Motion Strength  Shoulder R: WFL.  L: adducted and internally rotated due to spasticity R: WFL.  L: Adducted due to  spasticity, 1/5 overall  Elbow R: WFL.  L: does not have full flexion or extension due to  spasticity; rests in 130 deg extension R: WFL.  L: 0/5 flexion/extension  Wrist/Hand R: WFL.  L: rests in flexion due to spasticity, can passively be moved to neutral R: WFL.  L: fisting; 0/5 wrist and finger extension  Hip R and L: limited adduction and internal rotation R: WFL.  L: 0/5   Knee R: WFL.  L: knee flexion to 85 deg, lacking 50 deg to full extension R: WFL.  L: 3/5 knee flexion with spasticity, 0/5 flexion  Ankle R: WFL.  L: rests in plantarflexion, lacks 15 deg to neutral DF R: WFL.  L: 0/5 DF and PF     MOBILITY/BALANCE:  []  Patient is totally dependent for mobility  ?????    Balance Transfers Ambulation  Sitting Balance: Standing Balance: []  Independent []  Independent/Modified Independent  []  WFL     []  WFL []  Supervision []  Supervision  [x]  Uses UE for balance  []  Supervision []  Min Assist []  Ambulates with Assist  ?????    []  Min Assist [x]  Min assist [x]  Mod Assist []  Ambulates with Device:      []  RW  []  StW  []  Cane  []  ?????  []  Mod Assist []  Mod assist []  Max assist   []  Max Assist []  Max assist []  Dependent []  Indep. Short Distance Only  []  Unable []  Unable []  Lift / Sling Required Distance (in feet)  ?????   []  Sliding board [x]  Unable to Ambulate (see explanation below)  Cardio Status:  [x] Intact  []  Impaired   []  NA     ?????  Respiratory Status:  [] Intact   [x] Impaired   [] NA     deconditioned  Orthotics/Prosthetics: None  Comments (Address manual vs power w/c vs scooter): Pt is unable to safely ambulate with RW, cane or other AD due to significant L sided weakness and spasticity limiting AROM and PROM; pt also presents with significant foot drop and lacks sufficient balance to perform safe ambulation with AD and assistance.  Pt cannot perform independent mobility with manual wheelchair due to inability to utilize bilat UE for propulsion due weakness, tone and contractures.  Pt lacks sufficient endurance and strength to propel manual w/c long distances within his  assisted living home and in the community in order to work and attend GED classes at [employer].  Pt's manual w/c does not provide sufficient seating options to accommodate postural impairments or provide corrective forces to improve posture, function and pressure relief.  Pt lacks sufficient balance, trunk control, and UE strength to safely utilize scooter in his home and community.          Anterior / Posterior Obliquity Rotation-Pelvis Sits with weight shifted completely over R pelvis; lacks active weight shift over L pelvis to evenly distribute pressure  PELVIS    []  [x]  []   Neutral Posterior Anterior  []  []  [x]   WFL Rt elev Lt elev  [x]  []  []   WFL Right Left                      Anterior    Anterior     []  Fixed []  Other [x]  Partly Flexible []  Flexible   [x]  Fixed []  Other []  Partly Flexible  []  Flexible  []  Fixed []  Other []  Partly Flexible  []  Flexible   TRUNK  [x]  []  []   WFL ? Thoracic ? Lumbar  Kyphosis Lordosis  []  [x]  []   WFL Convex Convex  Right Left [x] c-curve [] s-curve [] multiple  [x]  Neutral []  Left-anterior []  Right-anterior     []  Fixed []  Flexible []  Partly Flexible []  Other  []  Fixed []  Flexible [x]  Partly Flexible []  Other  []  Fixed             []  Flexible []  Partly Flexible []  Other    Position Windswept  ?????  HIPS          []            [x]               []    Neutral       Abduct        ADduct         [x]           []            []   Neutral Right           Left      []  Fixed []  Subluxed [x]  Partly Flexible []  Dislocated []  Flexible  []  Fixed []  Other []  Partly Flexible  []  Flexible                 Foot Positioning Knee Positioning  Presents with increased extensor tone in LLE; pt unable to actively flex knee to return foot to foot plate; currently rests posterior heel/ankle on edge of foot plate creating increased pressure and risk for skin breakdown    []  WFL  [] Lt [x] Rt []  WFL  [x] Lt [] Rt    KNEES ROM concerns: ROM concerns:    &  [place]i-Flexed [] Lt [] Rt 85 deg L knee flexion <> lacking 20 deg of full knee extension    FEET Plantar Flexed [x] Lt [] Rt      Inversion                 [] Lt [] Rt      Eversion                 [x] Lt [x] Rt     HEAD [x]  Functional [x]  Good Head Control  Limited AROM into R rotation and flexion  & []  Flexed         []  Extended []  Adequate Head Control    NECK [x]  Rotated  Lt  []  Lat Flexed Lt []  Rotated  Rt []  Lat Flexed Rt []  Limited Head Control     []  Cervical Hyperextension []  Absent  Head Control     SHOULDERS ELBOWS WRIST& HAND L shoulder mildly subluxed      Left     Right    Left     Right    Left     Right   U/E [] Functional           [x] Functional Rests in 130 deg extension; unable to fully extend or flex to 90 deg WFL [x] Fisting             [] Fisting      [x] elev   [] dep      [] elev   [] dep       [x] pro -[] retract     [] pro  [] retract [x] subluxed             [] subluxed           Goals for Wheelchair Mobility  [x]  Independence with mobility in the home with motor related ADLs (MRADLs)  [x]  Independence with MRADLs in the community [x]  Provide dependent  mobility  [x]  Provide recline     [x] Provide tilt   Goals for Seating system [x]  Optimize pressure distribution [x]  Provide support needed to facilitate function or safety [x]  Provide corrective forces to assist with maintaining or improving posture [x]  Accommodate client's posture:   current seated postures and positions are not flexible or will not tolerate corrective forces [x]  Client to be independent with relieving pressure in the wheelchair [] Enhance physiological function such as breathing, swallowing, digestion  Simulation ideas/Equipment trials:????? State why other equipment was unsuccessful:Pt is unable to safely ambulate with RW, cane or other AD due to significant L sided weakness and spasticity limiting AROM and PROM; pt also presents with significant foot drop and lacks sufficient balance to perform safe ambulation  with AD and assistance.  Pt cannot perform independent mobility with manual wheelchair due to inability to utilize bilat UE for propulsion due weakness, tone and contractures.  Pt lacks sufficient endurance and strength to propel manual w/c long distances within his assisted living home and in the community in order to work and attend GED classes at [employer].  Pt's manual w/c does not provide sufficient seating options to accommodate postural impairments or provide corrective forces to improve posture, function and pressure relief.  Pt lacks sufficient balance, trunk control, and UE strength to safely utilize scooter in his home and community.    MOBILITY BASE RECOMMENDATIONS and JUSTIFICATION: MOBILITY COMPONENT JUSTIFICATION  Manufacturer: [employer] Model: Corpus F3 MPO   Size: Width 19Seat Depth 21 [x] provide transport from point A to B      [x] promote Indep mobility  [x] is not a safe, functional ambulator [x] walker or cane inadequate [] non-standard width/depth necessary to accommodate anatomical measurement []  ?????  [] Manual Mobility Base [] non-functional ambulator    [] Scooter/POV  [] can safely operate  [] can safely transfer   [] has adequate trunk stability  [] cannot functionally propel manual w/c  [x] Power Mobility Base  [x] non-ambulatory  [x] cannot functionally propel manual wheelchair  [x]  cannot functionally and safely operate scooter/POV [x] can safely operate and willing to  [] Stroller Base [] infant/child  [] unable to propel manual wheelchair [] allows for growth [] non-functional ambulator [] non-functional UE [] Indep mobility is not a goal at this time  [x] Tilt  [] Forward [x] Backward [x] Powered tilt  [] Manual tilt  [x] change position against gravitational force on head and shoulders  [x] change position for pressure relief/cannot weight shift [] transfers  [x] management of tone [x] rest periods [x] control edema [] facilitate postural control  []  ?????  [x] Recline   [x] Power recline on power base [] Manual recline on manual base  [x] accommodate femur to back angle  [] bring to full recline for ADL care  [x] change position for pressure relief/cannot weight shift [x] rest periods [] repositioning for transfers or clothing/diaper /catheter changes [] head positioning  [] Lighter weight required [] self- propulsion  [] lifting []  ?????  [] Heavy Duty required [] user weight greater than 250# [] extreme tone/ over active movement [] broken frame on previous chair []  ?????  [x]  Back  [x]  Angle Adjustable []  Custom molded Corpus Ergo Back 18 x 23-28 [x] postural control [x] control of tone/spasticity [x] accommodation of range of motion [] UE functional control [] accommodation for seating system []  ????? [x] provide lateral trunk support [] accommodate deformity [x] provide posterior trunk support [x] provide lumbar/sacral support [x] support trunk in midline [] Pressure relief over spinal processes  [x]  Seat Cushion Corpus Ergo Cushion with Arts development officer Cover [x] impaired sensation  [] decubitus ulcers present [] history of pressure ulceration [] prevent pelvic extension [x] low maintenance  [x] stabilize pelvis  [] accommodate obliquity [] accommodate multiple deformity [x] neutralize lower extremity position [x] increase pressure distribution []  ?????  [x]  Pelvic/thigh  support  [x]  Lateral thigh guide []  Distal medial pad  [x]  Distal lateral pad []  pelvis in neutral [] accommodate pelvis [x]  position upper legs [x]  alignment [x]  accommodate ROM []  decr adduction [x] accommodate tone [x] removable for transfers [x] decr abduction  []  Lateral trunk Supports []  Lt     []  Rt [] decrease lateral trunk leaning [] control tone [] contour for increased contact [] safety  [] accommodate asymmetry []  ?????  [x]  Mounting hardware  [] lateral trunk supports  [] back   [] seat [x] headrest      [x]  thigh support [] fixed   [] swing away [] attach seat platform/cushion to w/c  frame [] attach back cushion to w/c frame [] mount postural supports [x] mount headrest  [] swing medial thigh support away [] swing lateral supports away for transfers  [x]  Mount thigh support     Armrests  [] fixed [x] adjustable height [] removable   [] swing away  [x] flip back   [] reclining [x] full length pads [] desk    [] pads tubular  [x] provide support with elbow at 90   [] provide support for w/c tray [] change of height/angles for variable activities [x] remove for transfers [x] allow to come closer to table top [x] remove for access to tables []  ?????  Hangers/ Leg rests  [] 60 [] 70 [] 90 [x] elevating [] heavy duty  [] articulating [] fixed [] lift off [] swing away     [x] power [] provide LE support  [] accommodate to hamstring tightness [] elevate legs during recline   [] provide change in position for Legs [] Maintain placement of feet on footplate [] durability [] enable transfers [] decrease edema [] Accommodate lower leg length []  ?????  Foot support Footplate    [x] Lt  [x]  Rt  []  Center mount [x] flip up     [x] depth/angle adjustable [] Amputee adapter    []  Lt     []  Rt [x] provide foot support [x] accommodate to ankle ROM [x] transfers [] Provide support for residual extremity []  allow foot to go under wheelchair base [x]  decrease tone  []  ?????  []  Ankle strap/heel loops [] support foot on foot support [] decrease extraneous movement [] provide input to heel  [] protect foot  Tires: [] pneumatic  [x] flat free inserts  [] solid  [x] decrease maintenance  [x] prevent frequent flats [] increase shock absorbency [] decrease pain from road shock [] decrease spasms from road shock []  ?????  [x]  Headrest  [x] provide posterior head support [x] provide posterior neck support [] provide lateral head support [] provide anterior head support [x] support during tilt and recline [] improve feeding   [] improve respiration [] placement of switches [] safety  [] accommodate ROM  [] accommodate tone [] improve  visual orientation  []  Anterior chest strap []  Vest []  Shoulder retractors  [] decrease forward movement of shoulder [] accommodation of TLSO [] decrease forward movement of trunk [] decrease shoulder elevation [] added abdominal support [] alignment [] assistance with shoulder control  []  ?????  Pelvic Positioner [x] Belt [] SubASIS bar [] Dual Pull [] stabilize tone [x] decrease falling out of chair/ **will not Decr potential for sliding due to pelvic tilting [] prevent excessive rotation [] pad for protection over boney prominence [] prominence comfort [] special pull angle to control rotation []  ?????  Upper Extremity Support [] L   []  R [] Arm trough    [] hand support []  tray       [] full tray [] swivel mount [] decrease edema      [] decrease subluxation   [] control tone   [] placement for AAC/Computer/EADL [] decrease gravitational pull on shoulders [] provide midline positioning [] provide support to increase UE function [] provide hand support in natural position [] provide work surface   POWER WHEELCHAIR CONTROLS  [x] Proportional  [] Non-Proportional Type Joystick [] Left  [x] Right [x] provides access for controlling wheelchair   [] lacks motor control to operate proportional drive control [] unable to understand proportional  controls  Actuator Control Module  [] Single  [x] Multiple   [x] Allow the client to operate the power seat function(s) through the joystick control   [] Safety Reset Switches [] Used to change modes and stop the wheelchair when driving in latch mode    [x] Upgraded Electronics   [x] programming for accurate control [] progressive Disease/changing condition [] non-proportional drive control needed [x] Needed in order to operate power seat functions through joystick control   [] Display box [] Allows user to see in which mode and drive the wheelchair is set  [] necessary for alternate controls    [] Digital interface electronics [] Allows w/c to operate when using alternative drive  controls  [] ASL Head Array [] Allows client to operate wheelchair  through switches placed in tri-panel headrest  [] Sip and puff with tubing kit [] needed to operate sip and puff drive controls  [] Upgraded tracking electronics [] increase safety when driving [] correct tracking when on uneven surfaces  [x] Mount for switches or joystick [x] Attaches switches to w/c  [x] Swing away for access or transfers [] midline for optimal placement [] provides for consistent access  [] Attendant controlled joystick plus mount [] safety [] long distance driving [] operation of seat functions [] compliance with transportation regulations []  ?????    Rear wheel placement/Axle adjustability [] None [] semi adjustable [] fully adjustable  [] improved UE access to wheels [] improved stability [] changing angle in space for improvement of postural stability [] 1-arm drive access [] amputee pad placement []  ?????  Wheel rims/ hand rims  [] metal  [] plastic coated [] oblique projections [] vertical projections [] Provide ability to propel manual wheelchair  []  Increase self-propulsion with hand weakness/decreased grasp  Push handles [] extended  [] angle adjustable  [] standard [] caregiver access [] caregiver assist [] allows "hooking" to enable increased ability to perform ADLs or maintain balance  One armed device  [] Lt   [] Rt [] enable propulsion of manual wheelchair with one arm   []  ?????   Brake/wheel lock extension []  Lt   []  Rt [] increase indep in applying wheel locks   [] Side guards [] prevent clothing getting caught in wheel or becoming soiled []  prevent skin tears/abrasions  Battery: Grp 24 x 2 [x] to power wheelchair ?????  Other: Toggle Switches on Seat Function Control Unit To control power seat functions (tilt, recline, elevating leg rests) ?????  The above equipment has a life- long use expectancy. Growth and changes in medical and/or functional conditions would be the exceptions. This is to certify that the therapist has  no financial relationship with durable medical provider or manufacturer. The therapist will not receive remuneration of any kind for the equipment recommended in this evaluation.   Patient has mobility limitation that significantly impairs safe, timely participation in one or more mobility related ADL's.  (bathing, toileting, feeding, dressing, grooming, moving from room to room)                                                             [x]  Yes []  No Will mobility device sufficiently improve ability to participate and/or be aided in participation of MRADL's?         [x]  Yes []  No Can limitation be compensated for with use of a cane or walker?                                                                                []   Yes [x]  No Does patient or caregiver demonstrate ability/potential ability & willingness to safely use the mobility device?   [x]  Yes []  No Does patient's home environment support use of recommended mobility device?                                                    [x]  Yes []  No Does patient have sufficient upper extremity function necessary to functionally propel a manual wheelchair?    []  Yes [x]  No Does patient have sufficient strength and trunk stability to safely operate a POV (scooter)?                                  []  Yes [x]  No Does patient need additional features/benefits provided by a power wheelchair for MRADL's in the home?       [x]  Yes []  No Does the patient demonstrate the ability to safely use a power wheelchair?                                                              [x]  Yes []  No  Therapist Name Printed: Misty Stanley, PT, DPT Date: 06/28/2018  Therapist's Signature:   Date:   Supplier's Name Printed: Deberah Pelton, [employer] Date: 06/28/2018  Supplier's Signature:   Date:  Patient/Caregiver Signature:   Date:     This is to certify that I have read this evaluation and do agree with the content within:      Physician's Name Printed: Jeanette Caprice, PA-C  Physician's Signature:  Date:     This is to certify that I, the above signed therapist have the following affiliations: []  This DME provider []  Manufacturer of recommended equipment []  Patient's long term care facility [x]  None of the above              Objective measurements completed on examination: See above findings.              PT Education - 06/28/18 1509    Education Details  process for obtaining power w/c     Person(s) Educated  Patient    Methods  Explanation    Comprehension  Verbalized understanding                  Plan - 06/28/18 1510    Clinical Impression Statement  Pt is a 57 year old male referred to Neuro OPPT for evaluation for power mobility.  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   Power w/c evaluation completed.  Deberah Pelton, ATP from [employer] present and will be performing further assessment in patient's home environment.  Please see full LMN above for impairments, functional limitations and recommendations.    History and Personal Factors relevant to plan of care:  currently owns power and manual w/c that do not accomodate or correct postural needs; significant PMH, requires w/c for independent mobility for  work and school, lives in assisted living and requires assistance for ADL    Clinical Presentation  Evolving    Clinical Presentation due to:  currently owns power and manual w/c that do not accomodate or correct postural needs; significant PMH, requires w/c for independent mobility for work and school, lives in assisted living and requires assistance for ADL    Clinical Decision Making  Moderate    Rehab Potential  Good    PT Frequency  One time visit   w/c eval  only   PT Treatment/Interventions  Other (comment)   power wheelchair evaluation   Consulted and Agree with Plan of Care  Patient       Patient will benefit from skilled therapeutic intervention in order to improve the following deficits and impairments:  Decreased balance, Decreased endurance, Decreased activity tolerance, Decreased mobility, Decreased range of motion, Decreased strength, Difficulty walking, Increased edema, Impaired sensation, Impaired UE functional use, Postural dysfunction  Visit Diagnosis: Hemiplegia and hemiparesis following cerebral infarction affecting left non-dominant side (HCC)  Other symptoms and signs involving the nervous system  Abnormal posture  Muscle weakness (generalized)  Other disturbances of skin sensation  Unsteadiness on feet  Repeated falls     Problem List Patient Active Problem List   Diagnosis Date Noted  . Subdural hematoma ([place]) 04/26/2018  . Foot drop, left 08/21/2014  . Hemiparesis affecting left side as late effect of cerebrovascular accident ([place]) 04/18/2014  . Abnormality of gait 04/18/2014    Rico Junker, PT, DPT 06/28/18    3:16 PM    [facility] [address] [place], [place], [address] Phone: [phone]   Fax:  [phone]  Name: MAALIK PINN MRN: [id number] Date of Birth: Jan 03, 1961

## 2018-07-02 ENCOUNTER — Encounter: Payer: Self-pay | Admitting: *Deleted

## 2018-07-02 ENCOUNTER — Telehealth: Payer: Self-pay | Admitting: Physical Therapy

## 2018-07-02 NOTE — Telephone Encounter (Signed)
Pt would be interested in formal PT evaluation for strengthening, ROM, transfers and gait training.  Request for PT eval and treat order sent to pt's PCP via fax.  Dierdre Highman, PT, DPT 07/02/18    8:47 AM

## 2018-07-22 ENCOUNTER — Institutional Professional Consult (permissible substitution): Payer: Medicaid Other | Admitting: Cardiology

## 2018-07-23 ENCOUNTER — Encounter: Payer: Self-pay | Admitting: Cardiology

## 2018-11-17 ENCOUNTER — Telehealth: Payer: Self-pay | Admitting: *Deleted

## 2018-11-17 NOTE — Telephone Encounter (Signed)
08/19/2018 Message Contents  Sammy Cassar, Irean Hong, RN        FYI,   Just letting you know that we have made several attempts to contact patient (including sending a letter) to schedule his cardiac event monitor. Patient did not respond to [place]. Order has been cancelled.

## 2019-11-26 IMAGING — CT CT HEAD W/O CM
3 of 4 series · 14 of 47 positions shown, 17 images · non-contrast
Comparison: 04/26/2018 CT head.

ADDENDUM:
Subsequent submitted coronal reconstruction images demonstrates a
trace volume of subarachnoid hemorrhage within the left sylvian
fissure and within a sulcus of the left parietal lobe which is new
from the prior study (series 7, image 46).

These results will be called to the ordering clinician or
representative by the Radiologist Assistant, and communication
documented in the PACS or zVision Dashboard.
By: Yuichirou Kawabuchi M.D.
CLINICAL DATA: 57 y/o M; fall with subdural hematoma for follow-up.
EXAM:
CT HEAD WITHOUT CONTRAST
TECHNIQUE: Contiguous axial images were obtained from the base of the skull
through the vertex without intravenous contrast.

[Series 3: head 5.0 h30s · axial · 0.45mm/px · z∈[-77,+68]mm · 9 of 35 slices shown, 12 images]
[im 3/35  brain]
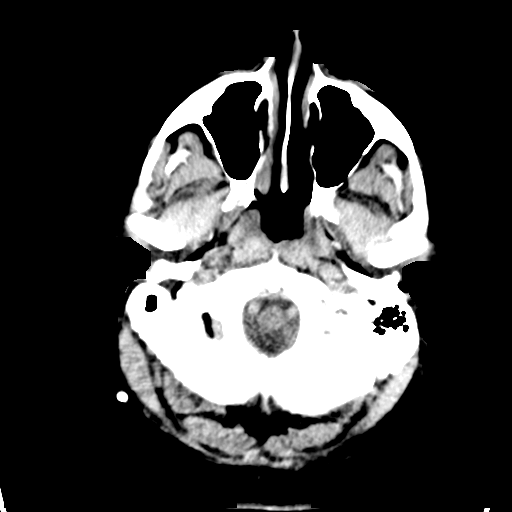
[im 3/35  bone]
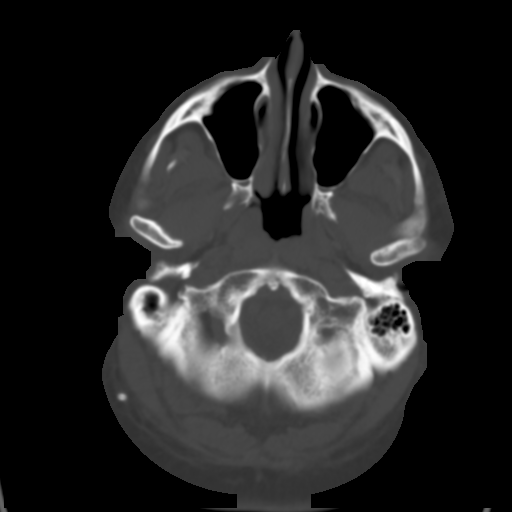
[im 6/35  brain]
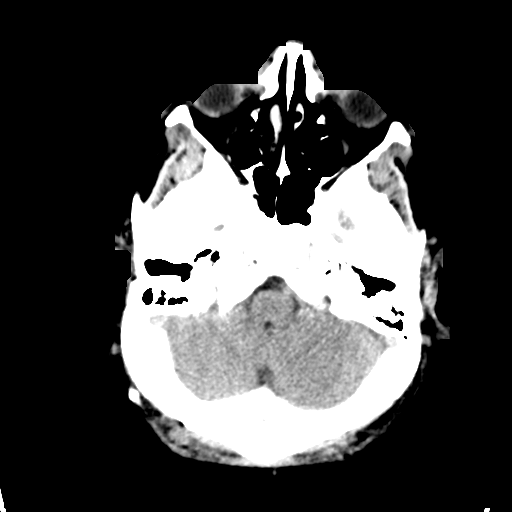
[im 10/35  brain]
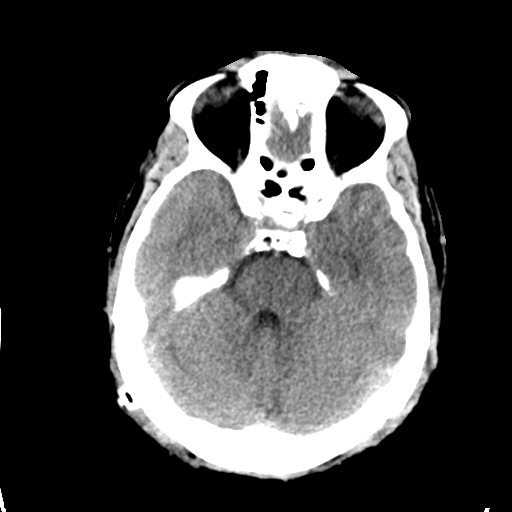
[im 13/35  brain]
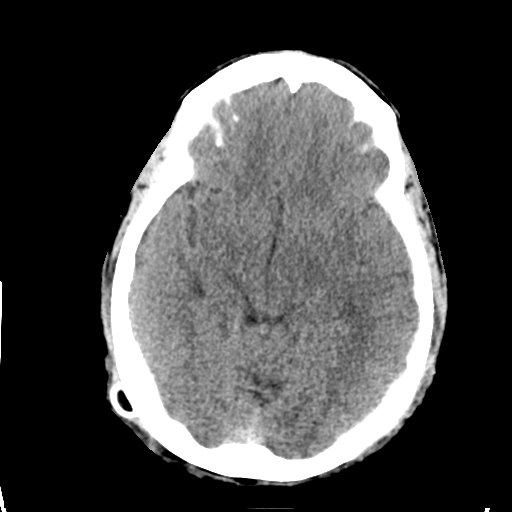
[im 18/35  brain]
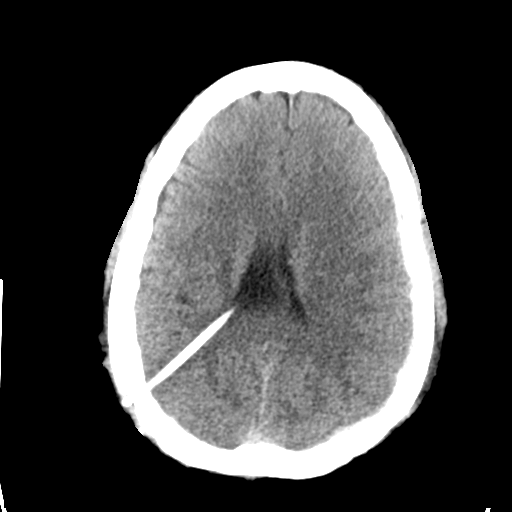
[im 18/35  bone]
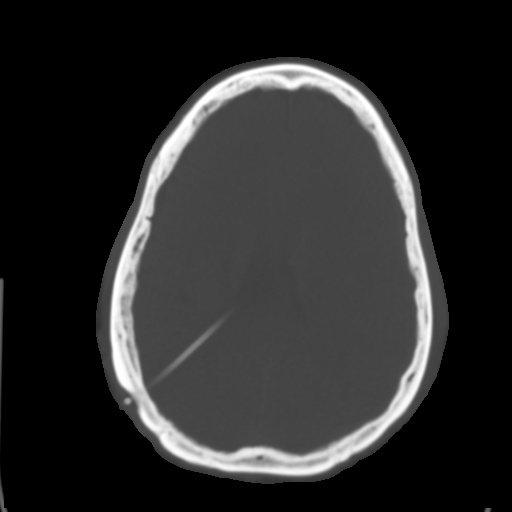
[im 22/35  brain]
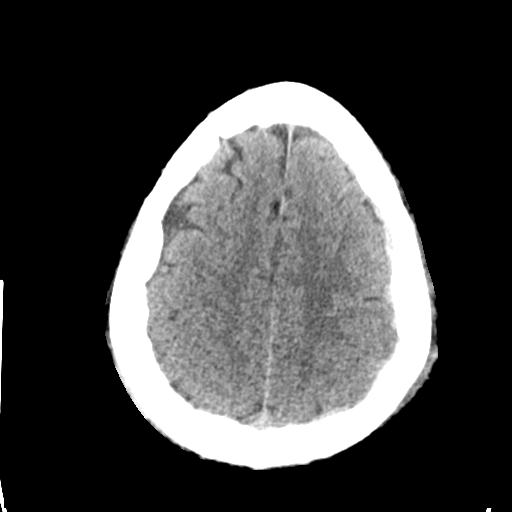
[im 25/35  brain]
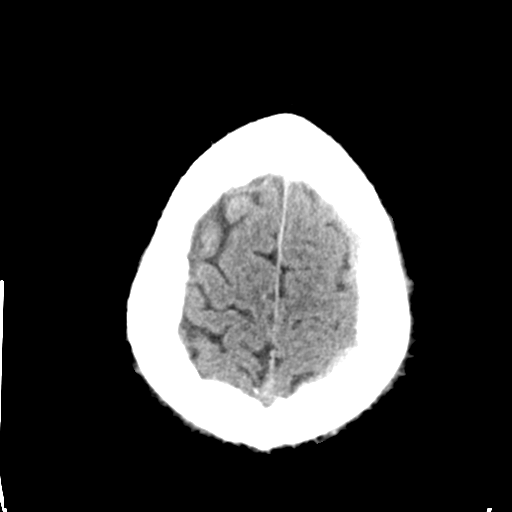
[im 29/35  brain]
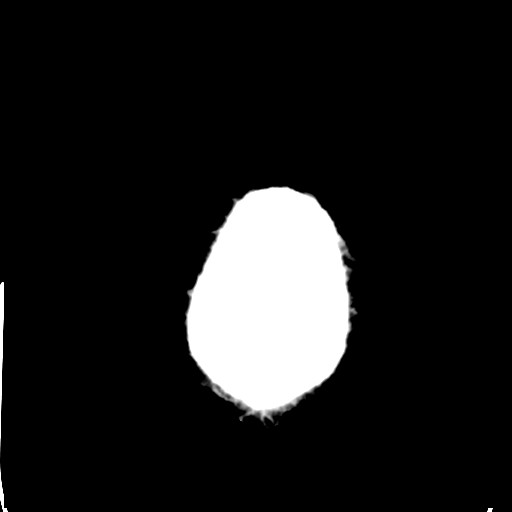
[im 32/35  brain]
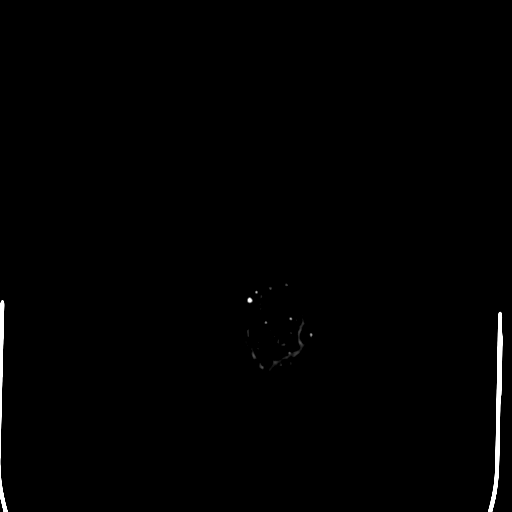
[im 32/35  bone]
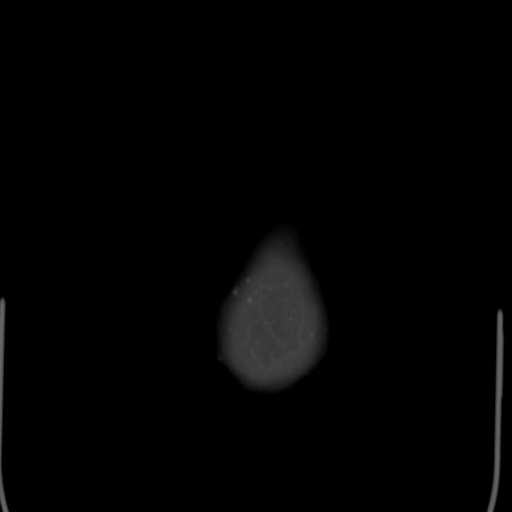

[Series 6: head 3.0 mpr sag · sagittal · 0.34mm/px · 2 of 67 slices shown]
[im 23/67  brain]
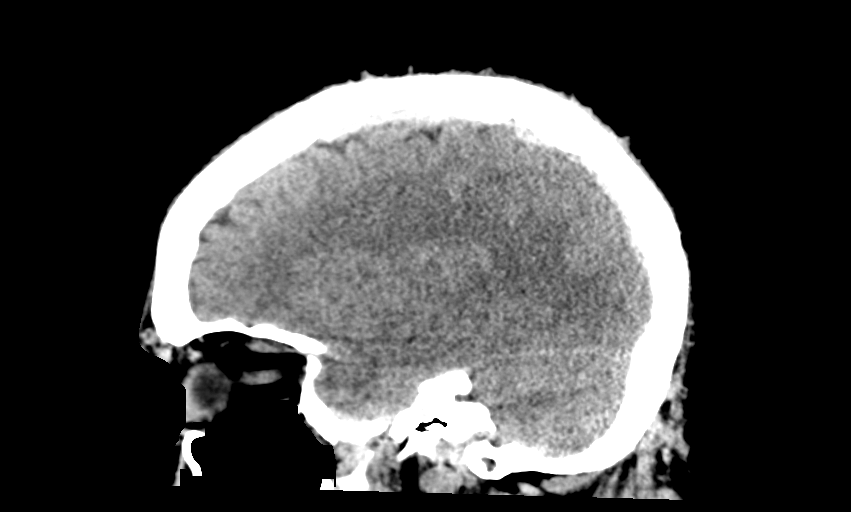
[im 45/67  brain]
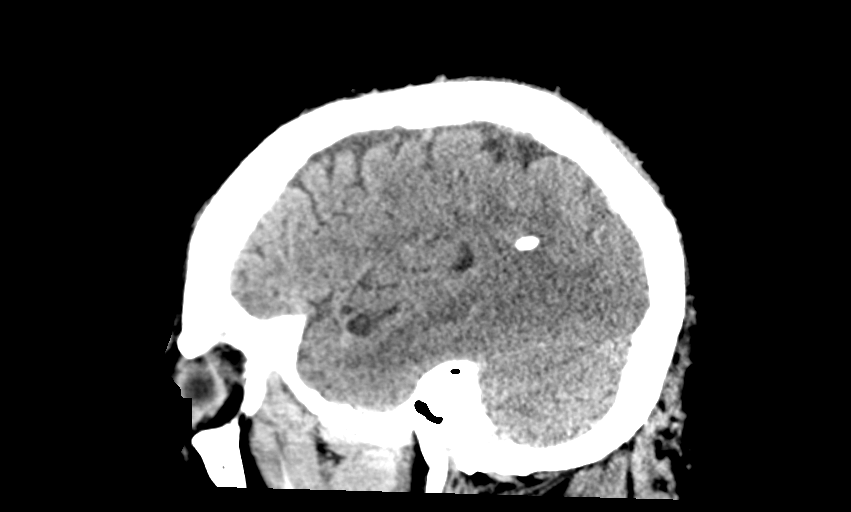

[Series 7: head 3.0 mpr cor · coronal · 0.33mm/px · 3 of 88 slices shown]
[im 30/88  brain]
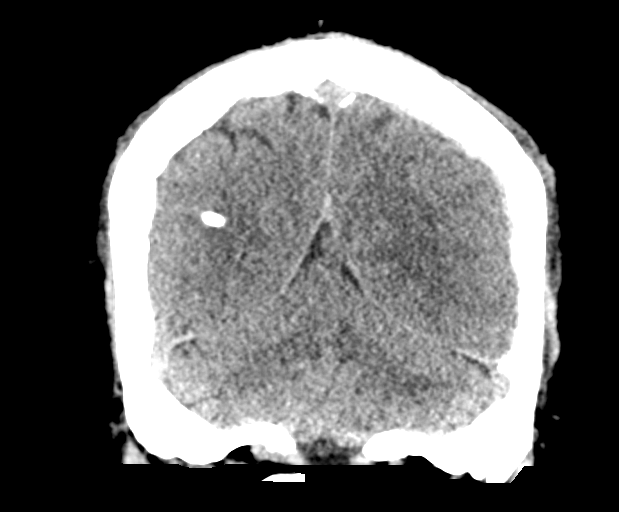
[im 39/88  brain]
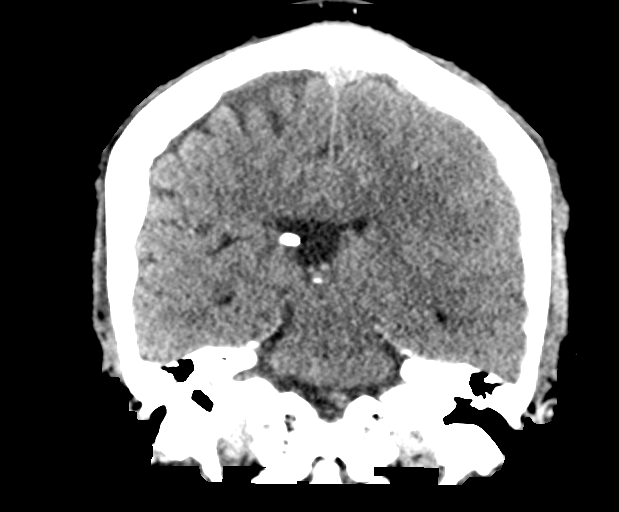
[im 49/88  brain]
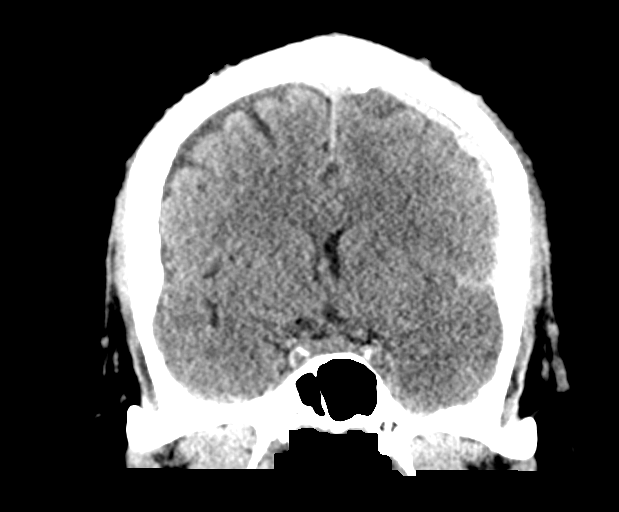

[14 of 47 positions shown; findings below may reference images not displayed]

FINDINGS: Brain: Stable 5 mm subdural hematoma over the left cerebral
convexity. Stable mass effect with 3 mm of left-to-right midline
shift. Stable right parietal approach ventriculostomy catheter with
tip in the posterior aspect of right lateral ventricle. No new acute
intracranial hemorrhage, stroke, or focal mass effect.

Vascular: Calcific atherosclerosis of carotid siphons. No hyperdense
vessel.

Skull: Chronic postsurgical changes related to a right parietal burr
hole. No acute osseous abnormality.

Sinuses/Orbits: No acute finding.

Other: None.
IMPRESSION: Stable 5 mm left cerebral convexity subdural hematoma with 3 mm of
left-to-right midline shift. No new acute intracranial abnormality.

By: Yuichirou Kawabuchi M.D.

## 2020-04-05 ENCOUNTER — Emergency Department (HOSPITAL_COMMUNITY)
Admission: EM | Admit: 2020-04-05 | Discharge: 2020-04-05 | Disposition: A | Discharge status: Skilled Nursing Facility | Payer: Medicaid Other | Attending: Emergency Medicine | Admitting: Emergency Medicine

## 2020-04-05 ENCOUNTER — Other Ambulatory Visit: Payer: Self-pay

## 2020-04-05 ENCOUNTER — Encounter (HOSPITAL_COMMUNITY): Payer: Self-pay | Admitting: Emergency Medicine

## 2020-04-05 DIAGNOSIS — F0390 Unspecified dementia without behavioral disturbance: Secondary | ICD-10-CM | POA: Diagnosis not present

## 2020-04-05 DIAGNOSIS — R339 Retention of urine, unspecified: Secondary | ICD-10-CM | POA: Insufficient documentation

## 2020-04-05 NOTE — Discharge Instructions (Signed)
The catheter needs to stay in place until urology removes the catheter.  Continue to take the flomax.

## 2020-04-05 NOTE — ED Triage Notes (Signed)
Patient BIBA from [facility], patient reporting urinary retention since Monday. Denies pain. Hx of CVA and dementia.    BP 124/76 P 68 RR 18 Spo2 95% RA

## 2020-04-05 NOTE — ED Provider Notes (Signed)
[name] Provider Note   CSN: [id number] Arrival date & time: 04/05/20  0630     History No chief complaint on file.   Wesley [name] is a 59 y.o. male.  Patient is a 59 year old male with a history of CKD, BPH, prior stroke with hemiparesis on the left side, VP shunt placement and gait abnormality who is presenting today from his assisted living facility with a complaint that he has been unable to urinate for the last 2 days.  Patient states usually it takes a little time for him to get his stream going and it is weak at first but then improves.  However for the last 2 days no urine has come out.  He denies any abdominal pain or distention.  He reports he has been eating and drinking normally but his stools are always pasty and he feels constipated.  He has not had any fever, vomiting, diarrhea or shortness of breath.  He has seen urologist in the past who reported that he was not completely emptying his bladder but he has never had to wear a Foley catheter for any prolonged period of time.  The history is provided by the patient.       Past Medical History:  Diagnosis Date  . Abnormality of gait 04/18/2014  . Anemia   . BPH (benign prostatic hyperplasia)   . Brainstem stroke (HCC)    right  . Chronic renal insufficiency   . CKD (chronic kidney disease)   . Depression   . Dyslipidemia   . Foot drop, left 08/21/2014  . Gait disorder   . GERD (gastroesophageal reflux disease)   . GERD (gastroesophageal reflux disease)   . 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 Mild dementia (HCC)   . Seizures (HCC)   . Ventricular shunt in place     Patient Active Problem List   Diagnosis Date Noted  . Subdural hematoma (HCC) 04/26/2018  . Foot drop, left 08/21/2014  . 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 Abnormality of gait 04/18/2014     Past Surgical History:  Procedure Laterality Date  . athroscopic surgery knee Left   . CAROTID ENDARTERECTOMY    . umilical hernia repair         Family History  Problem Relation Age of Onset  . Diabetes Mother        died of dm complication  . Brain cancer Father        brain tumor  . Alcoholism Sister     Social History   Tobacco Use  . Smoking status: Never Smoker  . Smokeless tobacco: Never Used  Vaping Use  . Vaping Use: Never used  Substance Use Topics  . Alcohol use: No  . Drug use: No    Home Medications Prior to Admission medications   Medication Sig Start Date End Date Taking? Authorizing Provider  acetaminophen (TYLENOL) 500 MG tablet Take 500 mg by mouth every 4 (four) hours as needed for fever or headache.     [provider]  alum & mag hydroxide-simeth (MAALOX PLUS) 400-400-40 MG/5ML suspension Take 15 mLs by mouth as needed for indigestion.    [provider]  baclofen (LIORESAL) 20 MG tablet Take 1 tablet (20 mg total) by mouth 3 (three) times daily. 02/20/15   York Spaniel, MD  bisacodyl (DULCOLAX) 5 MG EC tablet Take 5 mg by  mouth every other day.    [provider]  guaifenesin (ROBITUSSIN) 100 MG/5ML syrup Take 200 mg by mouth every 6 (six) hours as needed for cough.     [provider]  ibuprofen (ADVIL,MOTRIN) 800 MG tablet Take 800 mg by mouth every 6 (six) hours as needed (pain).    [provider]  magnesium citrate SOLN Take 1 Bottle by mouth See admin instructions. Take every 2 weeks as needed for severe constipation    [provider]  magnesium hydroxide (MILK OF MAGNESIA) 400 MG/5ML suspension Take 30 mLs by mouth at bedtime as needed for mild constipation.     [provider]  Melatonin 5 MG TABS Take 5 mg by mouth every evening.    [provider]  metoprolol tartrate (LOPRESSOR) 100 MG tablet Take 0.5 tablets (50 mg total) by mouth 2 (two) times daily. Hold  lopressor if sbp less than 90. 04/27/18   Albertine Grates, MD  Multiple Vitamin (MULTIVITAMIN) tablet Take 1 tablet by mouth daily.    [provider]  neomycin-bacitracin-polymyxin (NEOSPORIN) [phone] ointment Apply 1 application topically as needed (skin tears).     [provider]  polyethylene glycol (MIRALAX / GLYCOLAX) packet Take 17 g by mouth daily as needed for mild constipation.    [provider]  senna-docusate (SENOKOT-S) 8.6-50 MG tablet Take 1 tablet by mouth See admin instructions. Take 1 tablet by mouth three times a week as needed for constipation    [provider]  simvastatin (ZOCOR) 10 MG tablet Take 10 mg by mouth daily.    [provider]  tamsulosin (FLOMAX) 0.4 MG CAPS capsule Take 0.4 mg by mouth at bedtime.     [provider]  traZODone (DESYREL) 100 MG tablet Take 100 mg by mouth every evening.     [provider]    Allergies    Patient has no known allergies.  Review of Systems   Review of Systems  All other systems reviewed and are negative.   Physical Exam Updated Vital Signs BP 130/84 (BP Location: Right Arm)   Pulse 68   Temp 98.8 F (37.1 C) (Oral)   Resp 17   Ht 5\' 11"  (1.803 m)   Wt 117 kg   SpO2 96%   BMI 35.98 kg/m   Physical Exam Vitals and nursing note reviewed.  Constitutional:      General: He is not in acute distress.    Appearance: Normal appearance. He is obese.  HENT:     Head: Normocephalic.     Nose: Nose normal.     Mouth/Throat:     Mouth: Mucous membranes are moist.  Eyes:     Extraocular Movements: Extraocular movements intact.     Pupils: Pupils are equal, round, and reactive to light.  Cardiovascular:     Rate and Rhythm: Normal rate and regular rhythm.     Pulses: Normal pulses.  Pulmonary:     Effort: Pulmonary effort is normal. No respiratory distress.     Breath sounds: Normal breath sounds. No wheezing or rales.  Abdominal:     General: There is no  distension.     Tenderness: There is no abdominal tenderness.     Comments: Firm mass palpated to above the umbilicus that is nontender  Musculoskeletal:     Right lower leg: No edema.     Left lower leg: No edema.  Skin:    General: Skin is warm and dry.  Capillary Refill: Capillary refill takes less than 2 seconds.  Neurological:     Mental Status: He is alert and oriented to person, place, and time. Mental status is at baseline.  Psychiatric:        Mood and Affect: Mood normal.        Behavior: Behavior normal.     ED Results / Procedures / Treatments   Labs (all labs ordered are listed, but only abnormal results are displayed) Labs Reviewed - No data to display  EKG None  Radiology No results found.  Procedures Procedures (including critical care time)  Medications Ordered in ED Medications - No data to display  ED Course  I have reviewed the triage vital signs and the nursing notes.  Pertinent labs & imaging results that were available during my care of the patient were reviewed by me and considered in my medical decision making (see chart for details).    MDM Rules/Calculators/A&P                          Patient presenting today with a complaint of inability to urinate.  Patient has not had any urine output for the last 2 days.  Patient does have a firm mass palpated above the umbilicus and concern for urinary retention.  At this time patient is comfortable and is having no pain.  Vital signs are reassuring.  He denies any symptoms concerning for UTI.  He has no history suggestive of dehydration and low suspicion that this is neurogenic in nature.  Patient does have a history of BPH and reports he does have a weak stream regularly and has been told in the past he does not completely empty his bladder.  We will do a bladder scan and insert Foley catheter if needed.  If there is no significant signs of urinary retention then we will do labs.  12:28 PM Patient  had 2600 mL out from the Foley catheter.  He is already on Flomax.  Patient sent home with a leg bag and to follow-up with urology. Final Clinical Impression(s) / ED Diagnoses Final diagnoses:  Urinary retention    Rx / DC Orders ED Discharge Orders    None       Gwyneth Sprout, MD 04/05/20 1228

## 2020-04-05 NOTE — ED Notes (Signed)
Bladder scan = 900

## 2020-04-15 ENCOUNTER — Emergency Department (HOSPITAL_COMMUNITY)
Admission: EM | Admit: 2020-04-15 | Discharge: 2020-04-15 | Disposition: A | Discharge status: Home / Self Care | Payer: Medicaid Other | Attending: Emergency Medicine | Admitting: Emergency Medicine

## 2020-04-15 ENCOUNTER — Other Ambulatory Visit: Payer: Self-pay

## 2020-04-15 ENCOUNTER — Encounter (HOSPITAL_COMMUNITY): Payer: Self-pay | Admitting: Emergency Medicine

## 2020-04-15 DIAGNOSIS — R319 Hematuria, unspecified: Secondary | ICD-10-CM

## 2020-04-15 DIAGNOSIS — Y929 Unspecified place or not applicable: Secondary | ICD-10-CM | POA: Insufficient documentation

## 2020-04-15 DIAGNOSIS — Y999 Unspecified external cause status: Secondary | ICD-10-CM | POA: Diagnosis not present

## 2020-04-15 DIAGNOSIS — N189 Chronic kidney disease, unspecified: Secondary | ICD-10-CM | POA: Insufficient documentation

## 2020-04-15 DIAGNOSIS — T83091A Other mechanical complication of indwelling urethral catheter, initial encounter: Secondary | ICD-10-CM | POA: Insufficient documentation

## 2020-04-15 DIAGNOSIS — R339 Retention of urine, unspecified: Secondary | ICD-10-CM | POA: Diagnosis present

## 2020-04-15 DIAGNOSIS — X58XXXA Exposure to other specified factors, initial encounter: Secondary | ICD-10-CM | POA: Diagnosis not present

## 2020-04-15 DIAGNOSIS — Y939 Activity, unspecified: Secondary | ICD-10-CM | POA: Diagnosis not present

## 2020-04-15 DIAGNOSIS — Y828 Other medical devices associated with adverse incidents: Secondary | ICD-10-CM | POA: Diagnosis not present

## 2020-04-15 DIAGNOSIS — T839XXA Unspecified complication of genitourinary prosthetic device, implant and graft, initial encounter: Secondary | ICD-10-CM

## 2020-04-15 NOTE — ED Triage Notes (Signed)
Pt had his catheter changed today at 6pm. Pt currently has the catheter in. Pt reports blood in the urine. Pt reports no pain, just discomfort.

## 2020-04-15 NOTE — Discharge Instructions (Addendum)
Continue to make sure that the Foley catheter drained properly.  Keep your appointment with urology for Tuesday.  Return for any new or worse symptoms.

## 2020-04-15 NOTE — ED Notes (Signed)
Attempted to call [facility] for report; no answer

## 2020-04-15 NOTE — ED Notes (Signed)
Signature pad disabled; pt verbalized understanding of discharge instructions 

## 2020-04-15 NOTE — ED Provider Notes (Signed)
[facility]-EMERGENCY DEPT Provider Note   CSN: [id number] Arrival date & time: 04/15/20  2019     History Chief Complaint  Patient presents with  . Urinary Retention    Wesley [name] is a 59 y.o. male.  Patient seen in the emergency department July 22 for urinary retention.  Felt to be secondary to prostatic enlargement.  Because patient was having good flows prior to that.  Foley catheter placed at that time.  Apparently earlier today that that catheter clotted off and urine was not coming out.  They had a service that came replaced another catheter.  Patient without any pain but having some bleeding around the meatus.  And some blood-tinged urine.  But the leg bag is full of urine.  Shows good flow.  Patient completely comfortable.  Has follow-up with urology again on Tuesday.  Here with [facility].  Past medical history significant for the benign prostatic hyperplasia prior stroke in 2015 some mild dementia history of chronic kidney disease.        Past Medical History:  Diagnosis Date  . Abnormality of gait 04/18/2014  . Anemia   . BPH (benign prostatic hyperplasia)   . Brainstem stroke (HCC)    right  . Chronic renal insufficiency   . CKD (chronic kidney disease)   . Depression   . Dyslipidemia   . Foot drop, left 08/21/2014  . Gait disorder   . GERD (gastroesophageal reflux disease)   . GERD (gastroesophageal reflux disease)   . 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 Mild dementia (HCC)   . Seizures (HCC)   . Ventricular shunt in place     Patient Active Problem List   Diagnosis Date Noted  . Subdural hematoma (HCC) 04/26/2018  . Foot drop, left 08/21/2014  . 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 Abnormality of gait 04/18/2014    Past Surgical History:  Procedure Laterality Date  . athroscopic surgery knee Left   . CAROTID  ENDARTERECTOMY    . umilical hernia repair         Family History  Problem Relation Age of Onset  . Diabetes Mother        died of dm complication  . Brain cancer Father        brain tumor  . Alcoholism Sister     Social History   Tobacco Use  . Smoking status: Never Smoker  . Smokeless tobacco: Never Used  Vaping Use  . Vaping Use: Never used  Substance Use Topics  . Alcohol use: No  . Drug use: No    Home Medications Prior to Admission medications   Medication Sig Start Date End Date Taking? Authorizing Provider  acetaminophen (TYLENOL) 500 MG tablet Take 500 mg by mouth every 4 (four) hours as needed for fever or headache.     [provider]  alum & mag hydroxide-simeth (MAALOX PLUS) 400-400-40 MG/5ML suspension Take 15 mLs by mouth as needed for indigestion.    [provider]  amLODipine (NORVASC) 10 MG tablet Take 10 mg by mouth daily.    [provider]  baclofen (LIORESAL) 20 MG tablet Take 1 tablet (20 mg total) by mouth 3 (three) times daily. 02/20/15   York Spaniel, MD  guaifenesin (ROBITUSSIN) 100 MG/5ML syrup Take 200 mg by mouth every 6 (six) hours as needed for cough.     [provider]  loperamide (IMODIUM) 2 MG capsule Take 2 mg by mouth as needed for diarrhea or loose stools.    [provider]  magnesium citrate SOLN Take 1 Bottle by mouth See admin instructions. Take every 2 weeks as needed for severe constipation    [provider]  magnesium hydroxide (MILK OF MAGNESIA) 400 MG/5ML suspension Take 30 mLs by mouth at bedtime as needed for mild constipation.     [provider]  metoprolol succinate (TOPROL-XL) 50 MG 24 hr tablet Take 50 mg by mouth daily. Take with or immediately following a meal.    [provider]  metoprolol tartrate (LOPRESSOR) 100 MG tablet Take 0.5 tablets (50 mg total) by mouth 2 (two) times daily. Hold lopressor if sbp less than 90. Patient not taking:  Reported on 04/05/2020 04/27/18   Albertine Grates, MD  Multiple Vitamin (MULTIVITAMIN) tablet Take 1 tablet by mouth daily.    [provider]  neomycin-bacitracin-polymyxin (NEOSPORIN) [phone] ointment Apply 1 application topically as needed (skin tears).     [provider]  polyvinyl alcohol (LIQUIFILM TEARS) 1.4 % ophthalmic solution Place 1 drop into both eyes 3 (three) times daily as needed for dry eyes.    [provider]  tamsulosin (FLOMAX) 0.4 MG CAPS capsule Take 0.4 mg by mouth daily.     [provider]  traZODone (DESYREL) 100 MG tablet Take 100 mg by mouth every evening.     [provider]  zolpidem (AMBIEN) 5 MG tablet Take 5 mg by mouth at bedtime.    [provider]    Allergies    Patient has no known allergies.  Review of Systems   Review of Systems  Constitutional: Negative for chills and fever.  HENT: Negative for congestion, rhinorrhea and sore throat.   Eyes: Negative for visual disturbance.  Respiratory: Negative for cough and shortness of breath.   Cardiovascular: Negative for chest pain and leg swelling.  Gastrointestinal: Negative for abdominal pain, diarrhea, nausea and vomiting.  Genitourinary: Positive for hematuria. Negative for dysuria.  Musculoskeletal: Negative for back pain and neck pain.  Skin: Negative for rash.  Neurological: Negative for dizziness, light-headedness and headaches.  Hematological: Does not bruise/bleed easily.  Psychiatric/Behavioral: Negative for confusion.    Physical Exam Updated Vital Signs BP 122/77 (BP Location: Right Arm)   Pulse 77   Temp 99.2 F (37.3 C) (Oral)   Resp 17   Ht 1.803 m (5\' 11" )   Wt (!) 117 kg   SpO2 98%   BMI 35.98 kg/m   Physical Exam Vitals and nursing note reviewed.  Constitutional:      Appearance: He is well-developed.  HENT:     Head: Normocephalic and atraumatic.  Eyes:     Conjunctiva/sclera: Conjunctivae normal.  Cardiovascular:      Rate and Rhythm: Normal rate and regular rhythm.     Heart sounds: No murmur heard.   Pulmonary:     Effort: Pulmonary effort is normal. No respiratory distress.     Breath sounds: Normal breath sounds.  Abdominal:     General: There is no distension.     Palpations: Abdomen is soft.     Tenderness: There is no abdominal tenderness.     Comments:  abdomen soft and nontender.  Genitourinary:    Comments: Patient circumcised.  Some blood around the meatus.  But no active bleeding.  Foley catheter in place.  Leg bag is attached to his right thigh area.  Bag has pinkish urine and no blood clots. Musculoskeletal:     Cervical back: Neck supple.  Skin:    General: Skin is warm and dry.  Neurological:     Mental Status: He is alert and oriented to person, place, and time.     ED Results / Procedures / Treatments   Labs (all labs ordered are listed, but only abnormal results are displayed) Labs Reviewed - No data to display  EKG None  Radiology No results found.  Procedures Procedures (including critical care time)  Medications Ordered in ED Medications - No data to display  ED Course  I have reviewed the triage vital signs and the nursing notes.  Pertinent labs & imaging results that were available during my care of the patient were reviewed by me and considered in my medical decision making (see chart for details).    MDM Rules/Calculators/A&P                          Patient had the Foley catheter replaced at the [facility] house.  Following that he had some hematuria and some blood around the meatus area.  This now stopped bleeding.  Catheter moves in and out fine balloon does catch at an appropriate place.  Foley bag has sort of rose colored urine no clots.  Patient had the catheter placed for urinary retention due to see urology on Tuesday.  No need for labs at this point in time.  Catheter seems to be working appropriately.  Probably was traumatized when it was replaced.   Patient has no pain or discomfort.    Final Clinical Impression(s) / ED Diagnoses Final diagnoses:  Foley catheter problem, initial encounter (HCC)  Hematuria, unspecified type    Rx / DC Orders ED Discharge Orders    None       Vanetta Mulders, MD 04/15/20 2145

## 2020-04-15 NOTE — ED Notes (Signed)
[facility] called for transport.  

## 2020-04-17 ENCOUNTER — Emergency Department (HOSPITAL_COMMUNITY)
Admission: EM | Admit: 2020-04-17 | Discharge: 2020-04-18 | Disposition: A | Discharge status: Home / Self Care | Payer: Medicaid Other | Attending: Emergency Medicine | Admitting: Emergency Medicine

## 2020-04-17 ENCOUNTER — Other Ambulatory Visit: Payer: Self-pay

## 2020-04-17 ENCOUNTER — Encounter (HOSPITAL_COMMUNITY): Payer: Self-pay | Admitting: Emergency Medicine

## 2020-04-17 DIAGNOSIS — K5900 Constipation, unspecified: Secondary | ICD-10-CM | POA: Diagnosis not present

## 2020-04-17 DIAGNOSIS — N189 Chronic kidney disease, unspecified: Secondary | ICD-10-CM | POA: Insufficient documentation

## 2020-04-17 DIAGNOSIS — R140 Abdominal distension (gaseous): Secondary | ICD-10-CM | POA: Diagnosis not present

## 2020-04-17 DIAGNOSIS — F0390 Unspecified dementia without behavioral disturbance: Secondary | ICD-10-CM | POA: Insufficient documentation

## 2020-04-17 DIAGNOSIS — R339 Retention of urine, unspecified: Secondary | ICD-10-CM | POA: Insufficient documentation

## 2020-04-17 LAB — I-STAT CHEM 8, ED
BUN: 19 mg/dL (ref 8–23)
Calcium, Ion: 1.08 mmol/L — ABNORMAL LOW (ref 1.15–1.40)
Chloride: 96 mmol/L — ABNORMAL LOW (ref 98–111)
Creatinine, Ser: 1.5 mg/dL — ABNORMAL HIGH (ref 0.61–1.24)
Glucose, Bld: 103 mg/dL — ABNORMAL HIGH (ref 70–99)
HCT: 47 % (ref 39.0–52.0)
Hemoglobin: 16 g/dL (ref 13.0–17.0)
Potassium: 3.5 mmol/L (ref 3.5–5.1)
Sodium: 135 mmol/L (ref 135–145)
TCO2: 24 mmol/L (ref 22–32)

## 2020-04-17 MED ORDER — MAGNESIUM CITRATE PO SOLN
1.0000 | Freq: Once | ORAL | Status: AC
  Administered 2020-04-17: 1 via ORAL
  Filled 2020-04-17: qty 296

## 2020-04-17 NOTE — Discharge Instructions (Signed)
Make an appointment to follow-up with [facility] to have the Foley catheter removed.  Your creatinine (kidney function) was mildly elevated.  This needs to be rechecked by your primary care doctor.  Return here as needed if you have any worsening symptoms.

## 2020-04-17 NOTE — ED Provider Notes (Signed)
[facility]-EMERGENCY DEPT Provider Note   CSN: [id number] Arrival date & time: 04/17/20  2004     History Chief Complaint  Patient presents with  . Urinary Retention    Wesley [name] is a 59 y.o. male.  Patient is a 59 year old male who presents with urinary retention.  He originally had a Foley catheter placed on July 22.  He was at his urologist today and had it removed.  He has not been able to urinate since that time.  That was about 10:00 this morning.  He noticed a little bit of blood at the tip of his penis.  He denies any fevers.  No abdominal pain.  No nausea or vomiting.  He does say that he is constipated.  He has not had a bowel movement about 2 to 3 days.  He requests some mag citrate because he says that that always works for him.  He has a prior history of a stroke about 8 to 10 years ago.  He has left-sided hemiplegia.  He is wheelchair-bound.  He resides at [facility].        Past Medical History:  Diagnosis Date  . Abnormality of gait 04/18/2014  . Anemia   . BPH (benign prostatic hyperplasia)   . Brainstem stroke (HCC)    right  . Chronic renal insufficiency   . CKD (chronic kidney disease)   . Depression   . Dyslipidemia   . Foot drop, left 08/21/2014  . Gait disorder   . GERD (gastroesophageal reflux disease)   . GERD (gastroesophageal reflux disease)   . 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 Mild dementia (HCC)   . Seizures (HCC)   . Ventricular shunt in place     Patient Active Problem List   Diagnosis Date Noted  . Subdural hematoma (HCC) 04/26/2018  . Foot drop, left 08/21/2014  . 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 Abnormality of gait 04/18/2014    Past Surgical History:  Procedure Laterality Date  . athroscopic surgery knee Left   . CAROTID ENDARTERECTOMY    . umilical hernia repair          Family History  Problem Relation Age of Onset  . Diabetes Mother        died of dm complication  . Brain cancer Father        brain tumor  . Alcoholism Sister     Social History   Tobacco Use  . Smoking status: Never Smoker  . Smokeless tobacco: Never Used  Vaping Use  . Vaping Use: Never used  Substance Use Topics  . Alcohol use: No  . Drug use: No    Home Medications Prior to Admission medications   Medication Sig Start Date End Date Taking? Authorizing Provider  acetaminophen (TYLENOL) 500 MG tablet Take 500 mg by mouth every 4 (four) hours as needed for fever or headache.    Yes [provider]  alum & mag hydroxide-simeth (MAALOX PLUS) 400-400-40 MG/5ML suspension Take 15 mLs by mouth as needed for indigestion.   Yes [provider]  amLODipine (NORVASC) 10 MG tablet Take 10 mg by mouth daily.   Yes [provider]  baclofen (LIORESAL) 20 MG tablet Take 1 tablet (20 mg total) by mouth 3 (three) times daily. 02/20/15  Yes York Spaniel, MD  loperamide (IMODIUM) 2 MG capsule Take 2 mg by mouth  as needed for diarrhea or loose stools.   Yes [provider]  magnesium citrate SOLN Take 1 Bottle by mouth See admin instructions. Take every 2 weeks as needed for severe constipation   Yes [provider]  magnesium hydroxide (MILK OF MAGNESIA) 400 MG/5ML suspension Take 30 mLs by mouth at bedtime as needed for mild constipation.    Yes [provider]  metoprolol succinate (TOPROL-XL) 50 MG 24 hr tablet Take 50 mg by mouth daily. Take with or immediately following a meal.   Yes [provider]  Multiple Vitamin (MULTIVITAMIN) tablet Take 1 tablet by mouth daily.   Yes [provider]  neomycin-bacitracin-polymyxin (NEOSPORIN) [phone] ointment Apply 1 application topically as needed (skin tears).    Yes [provider]  polyvinyl alcohol (LIQUIFILM TEARS) 1.4 % ophthalmic solution Place 1  drop into both eyes 3 (three) times daily as needed for dry eyes.   Yes [provider]  tamsulosin (FLOMAX) 0.4 MG CAPS capsule Take 0.4 mg by mouth daily.    Yes [provider]  traZODone (DESYREL) 100 MG tablet Take 100 mg by mouth every evening.    Yes [provider]  zolpidem (AMBIEN) 5 MG tablet Take 5 mg by mouth at bedtime.   Yes [provider]  guaifenesin (ROBITUSSIN) 100 MG/5ML syrup Take 200 mg by mouth every 6 (six) hours as needed for cough.     [provider]  metoprolol tartrate (LOPRESSOR) 100 MG tablet Take 0.5 tablets (50 mg total) by mouth 2 (two) times daily. Hold lopressor if sbp less than 90. Patient not taking: Reported on 04/05/2020 04/27/18   Albertine Grates, MD    Allergies    Patient has no known allergies.  Review of Systems   Review of Systems  Constitutional: Negative for chills, diaphoresis, fatigue and fever.  HENT: Negative for congestion, rhinorrhea and sneezing.   Eyes: Negative.   Respiratory: Negative for cough, chest tightness and shortness of breath.   Cardiovascular: Negative for chest pain and leg swelling.  Gastrointestinal: Positive for constipation. Negative for abdominal pain, blood in stool, diarrhea, nausea and vomiting.  Genitourinary: Positive for difficulty urinating. Negative for flank pain, frequency and hematuria.  Musculoskeletal: Negative for arthralgias and back pain.  Skin: Negative for rash.  Neurological: Negative for dizziness, speech difficulty, weakness, numbness and headaches.    Physical Exam Updated Vital Signs BP (!) 147/85   Pulse 71   Temp 98.8 F (37.1 C)   Resp 18   SpO2 96%   Physical Exam Constitutional:      Appearance: He is well-developed.  HENT:     Head: Normocephalic and atraumatic.  Eyes:     Pupils: Pupils are equal, round, and reactive to light.  Cardiovascular:     Rate and Rhythm: Normal rate and regular rhythm.     Heart sounds: Normal heart sounds.   Pulmonary:     Effort: Pulmonary effort is normal. No respiratory distress.     Breath sounds: Normal breath sounds. No wheezing or rales.  Chest:     Chest wall: No tenderness.  Abdominal:     General: Bowel sounds are normal. There is distension (Mild distention).     Palpations: Abdomen is soft.     Tenderness: There is no abdominal tenderness. There is no guarding or rebound.  Genitourinary:    Comments: Circumcised male genitalia.  There is some blood at the urethral meatus. Musculoskeletal:  General: Normal range of motion.     Cervical back: Normal range of motion and neck supple.  Lymphadenopathy:     Cervical: No cervical adenopathy.  Skin:    General: Skin is warm and dry.     Findings: No rash.  Neurological:     Mental Status: He is alert and oriented to person, place, and time.     ED Results / Procedures / Treatments   Labs (all labs ordered are listed, but only abnormal results are displayed) Labs Reviewed  I-STAT CHEM 8, ED - Abnormal; Notable for the following components:      Result Value   Chloride 96 (*)    Creatinine, Ser 1.50 (*)    Glucose, Bld 103 (*)    Calcium, Ion 1.08 (*)    All other components within normal limits    EKG None  Radiology No results found.  Procedures Procedures (including critical care time)  Medications Ordered in ED Medications  magnesium citrate solution 1 Bottle (1 Bottle Oral Given 04/17/20 2239)    ED Course  I have reviewed the triage vital signs and the nursing notes.  Pertinent labs & imaging results that were available during my care of the patient were reviewed by me and considered in my medical decision making (see chart for details).    MDM Rules/Calculators/A&P                          Patient presents with urinary retention after having his Foley catheter removed earlier today.  A catheter was replaced and is draining well.  He had about 750 cc of urine in his bladder.  It is nonbloody.  It  is draining well.  He is on Flomax already.  His creatinine is mildly elevated as compared to his prior values.  Actually have one other prior value to compare it to.  I did discuss this with the patient and advised that he will need to have this rechecked by his primary care doctor.  I did not feel that we needed to recheck a urine given that his catheter was just removed earlier today.  He is afebrile.  He also complains of some constipation and requests mag citrate.  He has no abdominal pain.  No vomiting.  He was given a bottle of mag citrate but at this point is ready to go back to the nursing home.  He does not want a wait any longer.  If he does not have a bowel movement with that, the nursing facility can give him something else while he is there.  He was encouraged to call his urologist tomorrow for appointment to have the catheter removed at some point.  Return precautions were given. Final Clinical Impression(s) / ED Diagnoses Final diagnoses:  Urinary retention  Constipation, unspecified constipation type    Rx / DC Orders ED Discharge Orders    None       Rolan Bucco, MD 04/17/20 2315

## 2020-04-17 NOTE — ED Triage Notes (Signed)
Pt arrived iva [facility] from [facility] CC cathter Issues. Pt had cath removed at [facility] this morning and has not been able to urinated since 1000 this morning. Per EMS slight blood noted in brief and pt reports uncomfortable when urge to urinate  Happens. Pt also report constipation since Friday.   Hx CVA with left side deficits

## 2020-04-17 NOTE — ED Notes (Signed)
[facility] contacted for transport 

## 2020-04-17 NOTE — ED Triage Notes (Signed)
Patient arrives via EMS from [facility]. Patient had catheter changed at [facility] in 04/15/2020 and was evaluated here for bleeding at the meatus. Patient states that today his catheter was removed at [facility] at 1000 and no trial was completed to make sure patient could urinate before he left. Patient has not been able to urinate since.

## 2020-04-18 NOTE — ED Notes (Signed)
Patient unable to sign due to deficits from stroke. Patient verbalized understanding of catheter care and follow up

## 2020-04-22 ENCOUNTER — Other Ambulatory Visit: Payer: Self-pay

## 2020-04-22 ENCOUNTER — Emergency Department (HOSPITAL_COMMUNITY)
Admission: EM | Admit: 2020-04-22 | Discharge: 2020-04-22 | Disposition: A | Discharge status: Home / Self Care | Payer: Medicaid Other | Attending: Emergency Medicine | Admitting: Emergency Medicine

## 2020-04-22 DIAGNOSIS — K219 Gastro-esophageal reflux disease without esophagitis: Secondary | ICD-10-CM | POA: Insufficient documentation

## 2020-04-22 DIAGNOSIS — R109 Unspecified abdominal pain: Secondary | ICD-10-CM | POA: Diagnosis not present

## 2020-04-22 DIAGNOSIS — R339 Retention of urine, unspecified: Secondary | ICD-10-CM | POA: Diagnosis present

## 2020-04-22 DIAGNOSIS — R39198 Other difficulties with micturition: Secondary | ICD-10-CM | POA: Diagnosis not present

## 2020-04-22 DIAGNOSIS — N189 Chronic kidney disease, unspecified: Secondary | ICD-10-CM | POA: Diagnosis not present

## 2020-04-22 DIAGNOSIS — T83511A Infection and inflammatory reaction due to indwelling urethral catheter, initial encounter: Secondary | ICD-10-CM

## 2020-04-22 LAB — BASIC METABOLIC PANEL
Anion gap: 11 (ref 5–15)
BUN: 14 mg/dL (ref 8–23)
CO2: 23 mmol/L (ref 22–32)
Calcium: 8.7 mg/dL — ABNORMAL LOW (ref 8.9–10.3)
Chloride: 100 mmol/L (ref 98–111)
Creatinine, Ser: 1.31 mg/dL — ABNORMAL HIGH (ref 0.61–1.24)
GFR calc Af Amer: >60 mL/min (ref >60)
GFR calc non Af Amer: 58 mL/min — ABNORMAL LOW (ref >60)
Glucose, Bld: 115 mg/dL — ABNORMAL HIGH (ref 70–99)
Potassium: 3.8 mmol/L (ref 3.5–5.1)
Sodium: 134 mmol/L — ABNORMAL LOW (ref 135–145)

## 2020-04-22 LAB — CBC WITH DIFFERENTIAL/PLATELET
Abs Immature Granulocytes: 0.04 10*3/uL (ref 0.00–0.07)
Basophils Absolute: 0 10*3/uL (ref 0.0–0.1)
Basophils Relative: 0 %
Eosinophils Absolute: 0.1 10*3/uL (ref 0.0–0.5)
Eosinophils Relative: 1 %
HCT: 43.7 % (ref 39.0–52.0)
Hemoglobin: 14.5 g/dL (ref 13.0–17.0)
Immature Granulocytes: 1 %
Lymphocytes Relative: 15 %
Lymphs Abs: 1.3 10*3/uL (ref 0.7–4.0)
MCH: 27.4 pg (ref 26.0–34.0)
MCHC: 33.2 g/dL (ref 30.0–36.0)
MCV: 82.6 fL (ref 80.0–100.0)
Monocytes Absolute: 0.5 10*3/uL (ref 0.1–1.0)
Monocytes Relative: 6 %
Neutro Abs: 6.7 10*3/uL (ref 1.7–7.7)
Neutrophils Relative %: 77 %
Platelets: 395 10*3/uL (ref 150–400)
RBC: 5.29 MIL/uL (ref 4.22–5.81)
RDW: 13 % (ref 11.5–15.5)
WBC: 8.7 10*3/uL (ref 4.0–10.5)
nRBC: 0 % (ref 0.0–0.2)

## 2020-04-22 LAB — URINALYSIS, ROUTINE W REFLEX MICROSCOPIC
Bilirubin Urine: NEGATIVE
Glucose, UA: NEGATIVE mg/dL
Ketones, ur: NEGATIVE mg/dL
Nitrite: NEGATIVE
Protein, ur: 100 mg/dL — AB
RBC / HPF: >50 RBC/hpf — ABNORMAL HIGH (ref 0–5)
Specific Gravity, Urine: 1.012 (ref 1.005–1.030)
WBC, UA: >50 WBC/hpf — ABNORMAL HIGH (ref 0–5)
pH: 8 (ref 5.0–8.0)

## 2020-04-22 MED ORDER — LORAZEPAM 1 MG PO TABS
1.0000 mg | ORAL_TABLET | Freq: Once | ORAL | Status: AC
  Administered 2020-04-22: 1 mg via ORAL
  Filled 2020-04-22: qty 1

## 2020-04-22 MED ORDER — CEPHALEXIN 500 MG PO CAPS: 500.0000 mg | ORAL_CAPSULE | Freq: Three times a day (TID) | ORAL | 0 refills | Status: DC

## 2020-04-22 MED ORDER — CEPHALEXIN 500 MG PO CAPS
500.0000 mg | ORAL_CAPSULE | Freq: Once | ORAL | Status: AC
  Administered 2020-04-22: 500 mg via ORAL
  Filled 2020-04-22: qty 1

## 2020-04-22 MED ORDER — LORAZEPAM 2 MG/ML IJ SOLN: 1.0000 mg | Freq: Once | INTRAMUSCULAR | Status: DC

## 2020-04-22 NOTE — ED Triage Notes (Signed)
Pt BIBA from [facility]-  Per EMS- Pt reports noticing little to no urine output since 12p today.

## 2020-04-22 NOTE — ED Notes (Signed)
[facility] called for transport.  

## 2020-04-22 NOTE — ED Provider Notes (Signed)
[facility]-EMERGENCY DEPT Provider Note   CSN: [id number] Arrival date & time: 04/22/20  1718     History Chief Complaint  Patient presents with  . Urinary Retention    Wesley [name] is a 59 y.o. male hx of BPH with indwelling foley, CKD, here with urinary retention.  Patient has an indwelling Foley in place.  Patient states that the Foley was not draining since this morning.  Patient has severe suprapubic pain.  He was seen here recently and Foley was replaced.  Patient has seen urology multiple times and is already on Flomax.  The history is provided by the patient.       Past Medical History:  Diagnosis Date  . Abnormality of gait 04/18/2014  . Anemia   . BPH (benign prostatic hyperplasia)   . Brainstem stroke (HCC)    right  . Chronic renal insufficiency   . CKD (chronic kidney disease)   . Depression   . Dyslipidemia   . Foot drop, left 08/21/2014  . Gait disorder   . GERD (gastroesophageal reflux disease)   . GERD (gastroesophageal reflux disease)   . 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 Mild dementia (HCC)   . Seizures (HCC)   . Ventricular shunt in place     Patient Active Problem List   Diagnosis Date Noted  . Subdural hematoma (HCC) 04/26/2018  . Foot drop, left 08/21/2014  . 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 Abnormality of gait 04/18/2014    Past Surgical History:  Procedure Laterality Date  . athroscopic surgery knee Left   . CAROTID ENDARTERECTOMY    . umilical hernia repair         Family History  Problem Relation Age of Onset  . Diabetes Mother        died of dm complication  . Brain cancer Father        brain tumor  . Alcoholism Sister     Social History   Tobacco Use  . Smoking status: Never Smoker  . Smokeless tobacco: Never Used  Vaping Use  . Vaping Use: Never used  Substance Use Topics    . Alcohol use: No  . Drug use: No    Home Medications Prior to Admission medications   Medication Sig Start Date End Date Taking? Authorizing Provider  acetaminophen (TYLENOL) 500 MG tablet Take 500 mg by mouth every 4 (four) hours as needed for fever or headache.     [provider]  alum & mag hydroxide-simeth (MAALOX PLUS) 400-400-40 MG/5ML suspension Take 15 mLs by mouth as needed for indigestion.    [provider]  amLODipine (NORVASC) 10 MG tablet Take 10 mg by mouth daily.    [provider]  baclofen (LIORESAL) 20 MG tablet Take 1 tablet (20 mg total) by mouth 3 (three) times daily. 02/20/15   York Spaniel, MD  guaifenesin (ROBITUSSIN) 100 MG/5ML syrup Take 200 mg by mouth every 6 (six) hours as needed for cough.     [provider]  loperamide (IMODIUM) 2 MG capsule Take 2 mg by mouth as needed for diarrhea or loose stools.    [provider]  magnesium citrate SOLN Take 1 Bottle by mouth See admin instructions. Take every 2 weeks as needed for severe constipation    [provider]  magnesium hydroxide (MILK OF MAGNESIA) 400 MG/5ML suspension Take 30 mLs  by mouth at bedtime as needed for mild constipation.     [provider]  metoprolol succinate (TOPROL-XL) 50 MG 24 hr tablet Take 50 mg by mouth daily. Take with or immediately following a meal.    [provider]  metoprolol tartrate (LOPRESSOR) 100 MG tablet Take 0.5 tablets (50 mg total) by mouth 2 (two) times daily. Hold lopressor if sbp less than 90. Patient not taking: Reported on 04/05/2020 04/27/18   Albertine Grates, MD  Multiple Vitamin (MULTIVITAMIN) tablet Take 1 tablet by mouth daily.    [provider]  neomycin-bacitracin-polymyxin (NEOSPORIN) [phone] ointment Apply 1 application topically as needed (skin tears).     [provider]  polyvinyl alcohol (LIQUIFILM TEARS) 1.4 % ophthalmic solution Place 1 drop into both eyes 3 (three)  times daily as needed for dry eyes.    [provider]  tamsulosin (FLOMAX) 0.4 MG CAPS capsule Take 0.4 mg by mouth daily.     [provider]  traZODone (DESYREL) 100 MG tablet Take 100 mg by mouth every evening.     [provider]  zolpidem (AMBIEN) 5 MG tablet Take 5 mg by mouth at bedtime.    [provider]    Allergies    Patient has no known allergies.  Review of Systems   Review of Systems  Gastrointestinal: Positive for abdominal pain.  Genitourinary: Positive for difficulty urinating.  All other systems reviewed and are negative.   Physical Exam Updated Vital Signs BP 120/74   Pulse 68   Temp 97.8 F (36.6 C) (Oral)   Resp 18   SpO2 96%   Physical Exam Vitals and nursing note reviewed.  HENT:     Head: Normocephalic.     Nose: Nose normal.     Mouth/Throat:     Mouth: Mucous membranes are moist.  Eyes:     Extraocular Movements: Extraocular movements intact.     Pupils: Pupils are equal, round, and reactive to light.  Cardiovascular:     Rate and Rhythm: Normal rate and regular rhythm.     Pulses: Normal pulses.     Heart sounds: Normal heart sounds.  Pulmonary:     Effort: Pulmonary effort is normal.     Breath sounds: Normal breath sounds.  Abdominal:     General: Abdomen is flat.     Comments: + distended bladder   Musculoskeletal:        General: Normal range of motion.     Cervical back: Normal range of motion.  Skin:    General: Skin is warm.     Capillary Refill: Capillary refill takes less than 2 seconds.  Neurological:     General: No focal deficit present.     Mental Status: He is alert and oriented to person, place, and time.  Psychiatric:        Mood and Affect: Mood normal.        Behavior: Behavior normal.     ED Results / Procedures / Treatments   Labs (all labs ordered are listed, but only abnormal results are displayed) Labs Reviewed  BASIC METABOLIC PANEL - Abnormal; Notable for the  following components:      Result Value   Sodium 134 (*)    Glucose, Bld 115 (*)    Creatinine, Ser 1.31 (*)    Calcium 8.7 (*)    GFR calc non Af Amer 58 (*)    All other components within normal limits  URINALYSIS, ROUTINE W  REFLEX MICROSCOPIC - Abnormal; Notable for the following components:   APPearance TURBID (*)    Hgb urine dipstick SMALL (*)    Protein, ur 100 (*)    Leukocytes,Ua SMALL (*)    RBC / HPF >50 (*)    WBC, UA >50 (*)    Bacteria, UA MANY (*)    All other components within normal limits  URINE CULTURE  CBC WITH DIFFERENTIAL/PLATELET    EKG None  Radiology No results found.  Procedures Procedures (including critical care time)  Medications Ordered in ED Medications  cephALEXin (KEFLEX) capsule 500 mg (has no administration in time range)  LORazepam (ATIVAN) tablet 1 mg (1 mg Oral Given 04/22/20 1922)    ED Course  I have reviewed the triage vital signs and the nursing notes.  Pertinent labs & imaging results that were available during my care of the patient were reviewed by me and considered in my medical decision making (see chart for details).    MDM Rules/Calculators/A&P                          Wesley [name] is a 59 y.o. male here with suprapubic pain.  Patient does have a Foley but it is not draining.  Patient has a bladder scan with volume 800.  Will replace foley and check chemistry and UA.   7:55 PM Chemistry unremarkable. Foley drained 1 L. UA ? Colonization vs UTI. Urine culture sent. Will dc home with keflex.   Final Clinical Impression(s) / ED Diagnoses Final diagnoses:  None    Rx / DC Orders ED Discharge Orders    None       Charlynne Pander, MD 04/22/20 2006

## 2020-04-22 NOTE — Discharge Instructions (Signed)
Take keflex three times daily for a week.   Urine culture was sent and you will be called if you have a resistant organism.  See your urologist for follow-up.  Return to ER if the Foley is not draining, severe pain, blood clots in Foley catheter.

## 2020-04-22 NOTE — ED Notes (Signed)
Bladder scan showed 781 mL

## 2020-04-25 LAB — URINE CULTURE: Culture: >=100000 — AB

## 2020-04-26 ENCOUNTER — Telehealth: Payer: Self-pay | Admitting: Emergency Medicine

## 2020-04-26 NOTE — Telephone Encounter (Signed)
Post ED Visit - Positive Culture Follow-up  Culture report reviewed by antimicrobial stewardship pharmacist: [facility] []  , Pharm.D. []  Enzo Bi, Pharm.D., BCPS AQ-ID []  , Pharm.D., BCPS []  Celedonio Miyamoto, .D., BCPS []  [place], .D., BCPS, AAHIVP []  Georgina Pillion, Pharm.D., BCPS, AAHIVP []  [address], PharmD, BCPS []  , PharmD, BCPS []  [place], PharmD, BCPS []  [place], PharmD []  , PharmD, BCPS []  Estella Husk, PharmD  Pharmacy Team []  Lysle Pearl, PharmD []  , PharmD []  Phillips Climes, PharmD []  , Rph []  Agapito Games) , PharmD []  Verlan Friends, PharmD []  , PharmD [x]  Mervyn Gay, PharmD []  , PharmD []  Vinnie Level, PharmD []  [facility], PharmD []  , PharmD []  Len Childs, PharmD   Positive urine culture Treated with cephalexin, organism sensitive to the same and no further patient follow-up is required at this time.  04/26/2020, 9:57 AM

## 2020-05-17 ENCOUNTER — Encounter (HOSPITAL_COMMUNITY): Payer: Self-pay

## 2020-05-17 ENCOUNTER — Emergency Department (HOSPITAL_COMMUNITY)
Admission: EM | Admit: 2020-05-17 | Discharge: 2020-05-18 | Disposition: A | Discharge status: Skilled Nursing Facility | Payer: Medicaid Other | Attending: Emergency Medicine | Admitting: Emergency Medicine

## 2020-05-17 DIAGNOSIS — Z79899 Other long term (current) drug therapy: Secondary | ICD-10-CM | POA: Insufficient documentation

## 2020-05-17 DIAGNOSIS — T83091A Other mechanical complication of indwelling urethral catheter, initial encounter: Secondary | ICD-10-CM | POA: Diagnosis not present

## 2020-05-17 DIAGNOSIS — N189 Chronic kidney disease, unspecified: Secondary | ICD-10-CM | POA: Diagnosis not present

## 2020-05-17 DIAGNOSIS — T839XXA Unspecified complication of genitourinary prosthetic device, implant and graft, initial encounter: Secondary | ICD-10-CM

## 2020-05-17 DIAGNOSIS — K219 Gastro-esophageal reflux disease without esophagitis: Secondary | ICD-10-CM | POA: Diagnosis not present

## 2020-05-17 DIAGNOSIS — Z8673 Personal history of transient ischemic attack (TIA), and cerebral infarction without residual deficits: Secondary | ICD-10-CM | POA: Insufficient documentation

## 2020-05-17 DIAGNOSIS — R339 Retention of urine, unspecified: Secondary | ICD-10-CM | POA: Diagnosis present

## 2020-05-17 DIAGNOSIS — R109 Unspecified abdominal pain: Secondary | ICD-10-CM | POA: Diagnosis not present

## 2020-05-17 NOTE — ED Notes (Signed)
Pts foley catheter was irrigated and reattached back to drainage bag. Will continue to monitor to ensure catheter is no longer clogged.

## 2020-05-17 NOTE — ED Notes (Signed)
On reassessment pts foley catheter is draining at this time.

## 2020-05-17 NOTE — ED Provider Notes (Signed)
[facility]-EMERGENCY DEPT Provider Note   CSN: [id number] Arrival date & time: 05/17/20  1946     History Chief Complaint  Patient presents with  . Urinary Retention    Wesley [name] is a 59 y.o. male with past medical history of left hemiparesis status post CVA, CKD, urinary retention with chronic indwelling Foley catheter, presenting with acute urinary retention that began about 8 hours ago.  He states this feels similar to prior occurrences.  He has pain to his suprapubic area.  He states this is happened multiple times in the past, his Foley catheter was clogged and needed to be either replaced or flushed.  He is followed by Dr. Alvester Morin with urology.  He states he has plans this month for cystoscopy for evaluation of his urinary retention.  No fevers.  The history is provided by the patient.       Past Medical History:  Diagnosis Date  . Abnormality of gait 04/18/2014  . Anemia   . BPH (benign prostatic hyperplasia)   . Brainstem stroke (HCC)    right  . Chronic renal insufficiency   . CKD (chronic kidney disease)   . Depression   . Dyslipidemia   . Foot drop, left 08/21/2014  . Gait disorder   . GERD (gastroesophageal reflux disease)   . GERD (gastroesophageal reflux disease)   . 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 Mild dementia (HCC)   . Seizures (HCC)   . Ventricular shunt in place     Patient Active Problem List   Diagnosis Date Noted  . Subdural hematoma (HCC) 04/26/2018  . Foot drop, left 08/21/2014  . 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 Abnormality of gait 04/18/2014    Past Surgical History:  Procedure Laterality Date  . athroscopic surgery knee Left   . CAROTID ENDARTERECTOMY    . umilical hernia repair         Family History  Problem Relation Age of Onset  . Diabetes Mother        died of dm complication  .  Brain cancer Father        brain tumor  . Alcoholism Sister     Social History   Tobacco Use  . Smoking status: Never Smoker  . Smokeless tobacco: Never Used  Vaping Use  . Vaping Use: Never used  Substance Use Topics  . Alcohol use: No  . Drug use: No    Home Medications Prior to Admission medications   Medication Sig Start Date End Date Taking? Authorizing Provider  acetaminophen (TYLENOL) 500 MG tablet Take 500 mg by mouth every 4 (four) hours as needed for fever or headache.     [provider]  alum & mag hydroxide-simeth (MAALOX PLUS) 400-400-40 MG/5ML suspension Take 15 mLs by mouth as needed for indigestion.    [provider]  amLODipine (NORVASC) 10 MG tablet Take 10 mg by mouth daily.    [provider]  baclofen (LIORESAL) 20 MG tablet Take 1 tablet (20 mg total) by mouth 3 (three) times daily. 02/20/15   York Spaniel, MD  cephALEXin (KEFLEX) 500 MG capsule Take 1 capsule (500 mg total) by mouth 3 (three) times daily. 04/22/20   Charlynne Pander, MD  guaifenesin (ROBITUSSIN) 100 MG/5ML syrup Take 200 mg by mouth every 6 (six) hours as needed for cough.     [provider]  loperamide (IMODIUM) 2 MG capsule Take 2 mg by mouth as needed for diarrhea or loose stools.    [provider]  magnesium citrate SOLN Take 1 Bottle by mouth See admin instructions. Take every 2 weeks as needed for severe constipation    [provider]  magnesium hydroxide (MILK OF MAGNESIA) 400 MG/5ML suspension Take 30 mLs by mouth at bedtime as needed for mild constipation.     [provider]  metoprolol succinate (TOPROL-XL) 50 MG 24 hr tablet Take 50 mg by mouth daily. Take with or immediately following a meal.    [provider]  metoprolol tartrate (LOPRESSOR) 100 MG tablet Take 0.5 tablets (50 mg total) by mouth 2 (two) times daily. Hold lopressor if sbp less than 90. Patient not taking: Reported on 04/05/2020 04/27/18    Albertine Grates, MD  Multiple Vitamin (MULTIVITAMIN) tablet Take 1 tablet by mouth daily.    [provider]  neomycin-bacitracin-polymyxin (NEOSPORIN) [phone] ointment Apply 1 application topically as needed (skin tears).     [provider]  polyvinyl alcohol (LIQUIFILM TEARS) 1.4 % ophthalmic solution Place 1 drop into both eyes 3 (three) times daily as needed for dry eyes.    [provider]  tamsulosin (FLOMAX) 0.4 MG CAPS capsule Take 0.4 mg by mouth daily.     [provider]  traZODone (DESYREL) 100 MG tablet Take 100 mg by mouth every evening.     [provider]  zolpidem (AMBIEN) 5 MG tablet Take 5 mg by mouth at bedtime.    [provider]    Allergies    Patient has no known allergies.  Review of Systems   Review of Systems  Constitutional: Negative for fever.  Gastrointestinal: Positive for abdominal pain. Negative for nausea.  Genitourinary: Negative for flank pain.  All other systems reviewed and are negative.   Physical Exam Updated Vital Signs BP (!) 144/85 (BP Location: Right Arm)   Pulse 90   Temp 98.8 F (37.1 C) (Oral)   Resp (!) 22   SpO2 96%   Physical Exam Vitals and nursing note reviewed.  Constitutional:      Appearance: He is well-developed.     Comments: Pt appears very uncomfortable  HENT:     Head: Normocephalic and atraumatic.  Eyes:     Conjunctiva/sclera: Conjunctivae normal.  Cardiovascular:     Rate and Rhythm: Normal rate.  Pulmonary:     Effort: Pulmonary effort is normal. No respiratory distress.  Abdominal:     General: Bowel sounds are normal.     Palpations: Abdomen is soft.     Tenderness: There is abdominal tenderness (suprapubic with mild distension).  Genitourinary:    Comments: Clear yellow urine present in foley bag Skin:    General: Skin is warm.  Neurological:     Mental Status: He is alert.  Psychiatric:        Behavior: Behavior normal.     ED Results /  Procedures / Treatments   Labs (all labs ordered are listed, but only abnormal results are displayed) Labs Reviewed - No data to display  EKG None  Radiology No results found.  Procedures Procedures (including critical care time)  Medications Ordered in ED Medications - No data to display  ED Course  I have reviewed the triage vital signs and the nursing notes.  Pertinent labs & imaging results that were available during my care of the patient were reviewed by me and considered in my  medical decision making (see chart for details).    MDM Rules/Calculators/A&P                          Patient with chronic indwelling foley catheter, presenting with about 8 hours of foley not draining with progressively worsening lower abdominal discomfort. Denies any recent symptoms of UTI, no flank pain, nausea, or fever. Foley was irrigated and is draining properly. RN reporting about 700cc of urine output. Patient with significant improvement after drainage. Will discharge with instruction to follow with his urologist.  Discussed results, findings, treatment and follow up. Patient advised of return precautions. Patient verbalized understanding and agreed with plan.  Final Clinical Impression(s) / ED Diagnoses Final diagnoses:  Complication of Foley catheter, initial encounter [facility])    Rx / DC Orders ED Discharge Orders    None       Cloris Flippo, [place] N, PA-C 05/17/20 2236    Vanetta Mulders, MD 05/19/20 2052

## 2020-05-17 NOTE — Discharge Instructions (Signed)
Please follow with your urologist regarding your visit today.

## 2020-05-17 NOTE — ED Triage Notes (Signed)
Pt BIB EMS from [facility] c/o urinary rentention x 8 hour. Pt has urinary cath. Report there has been no attempt was made to irrigate the cath. Denies pain, fevers, N/V. Endorses discomfort. A&O x 4, transfers with assistance, non-ambulatory, and left sided deficits from previous CVA.

## 2020-05-18 NOTE — ED Notes (Signed)
[facility] notified of need for transport of pt back to residence.  

## 2020-05-24 ENCOUNTER — Other Ambulatory Visit: Payer: Self-pay

## 2020-05-24 ENCOUNTER — Encounter (HOSPITAL_COMMUNITY): Payer: Self-pay

## 2020-05-24 ENCOUNTER — Emergency Department (HOSPITAL_COMMUNITY)
Admission: EM | Admit: 2020-05-24 | Discharge: 2020-05-24 | Disposition: A | Discharge status: Skilled Nursing Facility | Payer: Medicaid Other | Attending: Emergency Medicine | Admitting: Emergency Medicine

## 2020-05-24 DIAGNOSIS — T83098A Other mechanical complication of other indwelling urethral catheter, initial encounter: Secondary | ICD-10-CM | POA: Insufficient documentation

## 2020-05-24 DIAGNOSIS — R39198 Other difficulties with micturition: Secondary | ICD-10-CM | POA: Diagnosis not present

## 2020-05-24 DIAGNOSIS — R339 Retention of urine, unspecified: Secondary | ICD-10-CM | POA: Insufficient documentation

## 2020-05-24 DIAGNOSIS — N189 Chronic kidney disease, unspecified: Secondary | ICD-10-CM | POA: Diagnosis not present

## 2020-05-24 DIAGNOSIS — Y69 Unspecified misadventure during surgical and medical care: Secondary | ICD-10-CM | POA: Diagnosis not present

## 2020-05-24 DIAGNOSIS — Z79899 Other long term (current) drug therapy: Secondary | ICD-10-CM | POA: Insufficient documentation

## 2020-05-24 DIAGNOSIS — T839XXA Unspecified complication of genitourinary prosthetic device, implant and graft, initial encounter: Secondary | ICD-10-CM

## 2020-05-24 DIAGNOSIS — F0390 Unspecified dementia without behavioral disturbance: Secondary | ICD-10-CM | POA: Insufficient documentation

## 2020-05-24 NOTE — ED Provider Notes (Signed)
[facility] Note   CSN: [id number] Arrival date & time: 05/24/20  0242     History Chief Complaint  Patient presents with  . Urinary Retention    Wesley [name] is a 59 y.o. male with history of prior CVA with residual left-sided hemiparesis, chronic renal insufficiency, dementia, and BPH with indwelling Foley catheter who presents to the emergency department with complaints of urinary retention.  He states that he last had urine output into his Foley catheter several hours ago, he feels that he needs to urinate, he has some suprapubic discomfort, he is concerned he is retaining that there is a problem with his Foley catheter.  He has had issues with this in the past and has required replacement and flushes in the emergency department.  He denies fever, chills, nausea, vomiting, flank pain, dysuria, or hematuria.  He is followed by urologist Dr. Alvester Morin.  HPI     Past Medical History:  Diagnosis Date  . Abnormality of gait 04/18/2014  . Anemia   . BPH (benign prostatic hyperplasia)   . Brainstem stroke (HCC)    right  . Chronic renal insufficiency   . CKD (chronic kidney disease)   . Depression   . Dyslipidemia   . Foot drop, left 08/21/2014  . Gait disorder   . GERD (gastroesophageal reflux disease)   . GERD (gastroesophageal reflux disease)   . 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 Mild dementia (HCC)   . Seizures (HCC)   . Ventricular shunt in place     Patient Active Problem List   Diagnosis Date Noted  . Subdural hematoma (HCC) 04/26/2018  . Foot drop, left 08/21/2014  . 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 Abnormality of gait 04/18/2014    Past Surgical History:  Procedure Laterality Date  . athroscopic surgery knee Left   . CAROTID ENDARTERECTOMY    . umilical hernia repair         Family History    Problem Relation Age of Onset  . Diabetes Mother        died of dm complication  . Brain cancer Father        brain tumor  . Alcoholism Sister     Social History   Tobacco Use  . Smoking status: Never Smoker  . Smokeless tobacco: Never Used  Vaping Use  . Vaping Use: Never used  Substance Use Topics  . Alcohol use: No  . Drug use: No    Home Medications Prior to Admission medications   Medication Sig Start Date End Date Taking? Authorizing Provider  acetaminophen (TYLENOL) 500 MG tablet Take 500 mg by mouth every 4 (four) hours as needed for fever or headache.     [provider]  alum & mag hydroxide-simeth (MAALOX PLUS) 400-400-40 MG/5ML suspension Take 15 mLs by mouth as needed for indigestion.    [provider]  amLODipine (NORVASC) 10 MG tablet Take 10 mg by mouth daily.    [provider]  baclofen (LIORESAL) 20 MG tablet Take 1 tablet (20 mg total) by mouth 3 (three) times daily. 02/20/15   York Spaniel, MD  cephALEXin (KEFLEX) 500 MG capsule Take 1 capsule (500 mg total) by mouth 3 (three) times daily. 04/22/20   Charlynne Pander, MD  guaifenesin (ROBITUSSIN) 100 MG/5ML syrup Take 200 mg by mouth every 6 (six) hours as needed for  cough.     [provider]  loperamide (IMODIUM) 2 MG capsule Take 2 mg by mouth as needed for diarrhea or loose stools.    [provider]  magnesium citrate SOLN Take 1 Bottle by mouth See admin instructions. Take every 2 weeks as needed for severe constipation    [provider]  magnesium hydroxide (MILK OF MAGNESIA) 400 MG/5ML suspension Take 30 mLs by mouth at bedtime as needed for mild constipation.     [provider]  metoprolol succinate (TOPROL-XL) 50 MG 24 hr tablet Take 50 mg by mouth daily. Take with or immediately following a meal.    [provider]  metoprolol tartrate (LOPRESSOR) 100 MG tablet Take 0.5 tablets (50 mg total) by mouth 2 (two) times daily.  Hold lopressor if sbp less than 90. Patient not taking: Reported on 04/05/2020 04/27/18   Albertine Grates, MD  Multiple Vitamin (MULTIVITAMIN) tablet Take 1 tablet by mouth daily.    [provider]  neomycin-bacitracin-polymyxin (NEOSPORIN) [phone] ointment Apply 1 application topically as needed (skin tears).     [provider]  polyvinyl alcohol (LIQUIFILM TEARS) 1.4 % ophthalmic solution Place 1 drop into both eyes 3 (three) times daily as needed for dry eyes.    [provider]  tamsulosin (FLOMAX) 0.4 MG CAPS capsule Take 0.4 mg by mouth daily.     [provider]  traZODone (DESYREL) 100 MG tablet Take 100 mg by mouth every evening.     [provider]  zolpidem (AMBIEN) 5 MG tablet Take 5 mg by mouth at bedtime.    [provider]    Allergies    Patient has no known allergies.  Review of Systems   Review of Systems  Constitutional: Negative for chills and fever.  Respiratory: Negative for cough and shortness of breath.   Cardiovascular: Negative for chest pain.  Gastrointestinal: Positive for abdominal pain. Negative for nausea and vomiting.  Genitourinary: Positive for difficulty urinating. Negative for dysuria, flank pain, hematuria, scrotal swelling and testicular pain.  Neurological: Negative for syncope.  All other systems reviewed and are negative.  Physical Exam Updated Vital Signs BP (!) 152/93   Pulse 65   Temp 98.8 F (37.1 C) (Oral)   Resp 18   Ht 5\' 11"  (1.803 m)   Wt 117.5 kg   SpO2 98%   BMI 36.12 kg/m   Physical Exam Vitals and nursing note reviewed.  Constitutional:      General: He is not in acute distress.    Appearance: He is well-developed. He is not toxic-appearing.  HENT:     Head: Normocephalic and atraumatic.  Eyes:     General:        Right eye: No discharge.        Left eye: No discharge.     Conjunctiva/sclera: Conjunctivae normal.  Cardiovascular:     Rate and Rhythm: Normal rate and  regular rhythm.  Pulmonary:     Effort: Pulmonary effort is normal. No respiratory distress.     Breath sounds: Normal breath sounds. No wheezing, rhonchi or rales.  Abdominal:     General: There is no distension.     Palpations: Abdomen is soft.     Tenderness: There is abdominal tenderness (suprapubic). There is no guarding or rebound.  Genitourinary:    Comments: Foley catheter with yellow cloudy urine with sediments present in catheter bag. Tubing is disconnected. Chaperone present.  Musculoskeletal:     Cervical back:  Neck supple.  Skin:    General: Skin is warm and dry.     Findings: No rash.  Neurological:     Mental Status: He is alert.     Comments: Clear speech.   Psychiatric:        Behavior: Behavior normal.     ED Results / Procedures / Treatments   Labs (all labs ordered are listed, but only abnormal results are displayed) Labs Reviewed  URINE CULTURE    EKG None  Radiology No results found.  Procedures Procedures (including critical care time)  Medications Ordered in ED Medications - No data to display  ED Course  I have reviewed the triage vital signs and the nursing notes.  Pertinent labs & imaging results that were available during my care of the patient were reviewed by me and considered in my medical decision making (see chart for details).    MDM Rules/Calculators/A&P                         Patient with a history of BPH with chronic indwelling Foley catheter presents to the emergency department with several hours of urinary retention with lower abdominal discomfort.  He is nontoxic, his vitals are without significant abnormality, mildly elevated BP, doubt hypertensive emergency.  He has some mild suprapubic tenderness to palpation on exam.  Foley catheter tubing not functional, nurses unable to flush, catheter was removed with insertion of new Foley with good urine output of 750 cc and symptomatic relief.  Patient has not had any recent UTI  symptoms, has not complained of flank pain, nausea, vomiting, fever, dysuria, hematuria.  Urine was sent for culture, suspect colonization.  He is feeling improved and appears appropriate for discharge home with follow up with his urologist. I discussed  treatment plan, need for follow-up, and return precautions with the patient. Provided opportunity for questions, patient confirmed understanding and is in agreement with plan.   Findings and plan of care discussed with supervising physician Dr. Jacqulyn Bath who is in agreement.    Final Clinical Impression(s) / ED Diagnoses Final diagnoses:  Urinary retention  Problem with Foley catheter, initial encounter [facility])    Rx / DC Orders ED Discharge Orders    None       Cherly Anderson, PA-C 05/24/20 0351    Maia Plan, MD 05/27/20 1846

## 2020-05-24 NOTE — ED Triage Notes (Signed)
Pt arrives EMS from [facility] with c/o urinary retention for several hours and suprapubic pain. Pt woke up and foley bag was empty. Upon arrival Foley is disconnected and clogged.

## 2020-05-24 NOTE — Discharge Instructions (Addendum)
You were seen in the emergency department today due to a problem with your Foley catheter, this was not functioning appropriately therefore it was removed and replaced.  Please follow-up with your urologist within 3 days.  Return to the ER for new or worsening symptoms including but not limited to return of problems urinating, pain with urination, blood in your urine, fever, vomiting, back pain, abdominal pain, or any other concerns.

## 2020-05-25 LAB — URINE CULTURE: Culture: >=100000 — AB

## 2020-05-26 ENCOUNTER — Telehealth (HOSPITAL_BASED_OUTPATIENT_CLINIC_OR_DEPARTMENT_OTHER): Payer: Self-pay | Admitting: Emergency Medicine

## 2020-05-26 NOTE — Telephone Encounter (Signed)
Post ED Visit - Positive Culture Follow-up  Culture report reviewed by antimicrobial stewardship pharmacist: [facility] []  , Pharm.D. []  Enzo Bi, Pharm.D., BCPS AQ-ID []  , Pharm.D., BCPS []  Celedonio Miyamoto, Pharm.D., BCPS []  [place], Garvin Fila.D., BCPS, AAHIVP []  , Pharm.D., BCPS, AAHIVP []  Georgina Pillion, PharmD, BCPS []  , PharmD, BCPS []  [place], PharmD, BCPS []  [address], PharmD []  , PharmD, BCPS []  Estella Husk, PharmD  Pharmacy Team [x]  Lysle Pearl, PharmD []  , PharmD []  Phillips Climes, PharmD []  , Rph []  Agapito Games) , PharmD []  Verlan Friends, PharmD []  , PharmD []  Mervyn Gay, PharmD []  , PharmD []  Vinnie Level, PharmD []  [facility], PharmD []  , PharmD []  Len Childs, PharmD   Positive urine culture No further patient follow-up is required at this time.  Ferlin Fairhurst 05/26/2020, 4:04 PM

## 2020-06-10 ENCOUNTER — Emergency Department (HOSPITAL_COMMUNITY)
Admission: EM | Admit: 2020-06-10 | Discharge: 2020-06-11 | Disposition: A | Discharge status: Home / Self Care | Payer: Medicaid Other | Attending: Emergency Medicine | Admitting: Emergency Medicine

## 2020-06-10 ENCOUNTER — Encounter (HOSPITAL_COMMUNITY): Payer: Self-pay | Admitting: Emergency Medicine

## 2020-06-10 ENCOUNTER — Other Ambulatory Visit: Payer: Self-pay

## 2020-06-10 DIAGNOSIS — Z466 Encounter for fitting and adjustment of urinary device: Secondary | ICD-10-CM | POA: Insufficient documentation

## 2020-06-10 DIAGNOSIS — N189 Chronic kidney disease, unspecified: Secondary | ICD-10-CM | POA: Diagnosis not present

## 2020-06-10 DIAGNOSIS — F0390 Unspecified dementia without behavioral disturbance: Secondary | ICD-10-CM | POA: Diagnosis not present

## 2020-06-10 DIAGNOSIS — T83091A Other mechanical complication of indwelling urethral catheter, initial encounter: Secondary | ICD-10-CM | POA: Diagnosis present

## 2020-06-10 DIAGNOSIS — T83031A Leakage of indwelling urethral catheter, initial encounter: Secondary | ICD-10-CM | POA: Insufficient documentation

## 2020-06-10 DIAGNOSIS — T839XXA Unspecified complication of genitourinary prosthetic device, implant and graft, initial encounter: Secondary | ICD-10-CM

## 2020-06-10 NOTE — ED Triage Notes (Addendum)
Pt BIB [facility] from [facility]. He reports suprapubic pressure and leaking around the foley cath. States that someone came and messed with it and it has been leaking since.

## 2020-06-11 LAB — URINALYSIS, ROUTINE W REFLEX MICROSCOPIC
Bilirubin Urine: NEGATIVE
Glucose, UA: NEGATIVE mg/dL
Ketones, ur: NEGATIVE mg/dL
Nitrite: NEGATIVE
Protein, ur: 100 mg/dL — AB
Specific Gravity, Urine: 1.011 (ref 1.005–1.030)
WBC, UA: >50 WBC/hpf — ABNORMAL HIGH (ref 0–5)
pH: 7 (ref 5.0–8.0)

## 2020-06-11 NOTE — ED Provider Notes (Signed)
[facility]-EMERGENCY DEPT [facility] Emergency Department Provider Note MRN:  [id number]  Arrival date & time: 06/11/20     Chief Complaint   Leaking Catheter   History of Present Illness   Wesley [name] is a 59 y.o. year-old male with a history of stroke presenting to the ED with chief complaint of Foley catheter issue.  Drainage and leaking around the Foley catheter today.  No pain, no fever, no other complaints.  Thinks that the catheter is clogged.  Review of Systems  A problem-focused ROS was performed. Positive for leaking catheter.  Patient denies fever.  Patient's Health History    Past Medical History:  Diagnosis Date  . Abnormality of gait 04/18/2014  . Anemia   . BPH (benign prostatic hyperplasia)   . Brainstem stroke (HCC)    right  . Chronic renal insufficiency   . CKD (chronic kidney disease)   . Depression   . Dyslipidemia   . Foot drop, left 08/21/2014  . Gait disorder   . GERD (gastroesophageal reflux disease)   . GERD (gastroesophageal reflux disease)   . 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 Mild dementia (HCC)   . Seizures (HCC)   . Ventricular shunt in place     Past Surgical History:  Procedure Laterality Date  . athroscopic surgery knee Left   . CAROTID ENDARTERECTOMY    . umilical hernia repair      Family History  Problem Relation Age of Onset  . Diabetes Mother        died of dm complication  . Brain cancer Father        brain tumor  . Alcoholism Sister     Social History   Socioeconomic History  . Marital status: Single    Spouse name: Not on file  . Number of children: 2  . Years of education: 9th  . Highest education level: Not on file  Occupational History  . Occupation: disability  Tobacco Use  . Smoking status: Never Smoker  . Smokeless tobacco: Never Used  Vaping Use  . Vaping Use: Never used  Substance and Sexual Activity  . Alcohol use: No  . Drug use: No    . Sexual activity: Not on file  Other Topics Concern  . Not on file  Social History Narrative   Patient drinks about 2 cups of coffee daily.   Patient is right handed.   Resides at [facility] [phone]   Social Determinants of Health   Financial Resource Strain:   . Difficulty of Paying Living Expenses: Not on file  Food Insecurity:   . Worried About Programme researcher, broadcasting/film/video in the Last Year: Not on file  . Ran Out of Food in the Last Year: Not on file  Transportation Needs:   . Lack of Transportation (Medical): Not on file  . Lack of Transportation (Non-Medical): Not on file  Physical Activity:   . Days of Exercise per Week: Not on file  . Minutes of Exercise per Session: Not on file  Stress:   . Feeling of Stress : Not on file  Social Connections:   . Frequency of Communication with Friends and Family: Not on file  . Frequency of Social Gatherings with Friends and Family: Not on file  . Attends Religious Services: Not on file  . Active Member of Clubs or Organizations: Not on file  . Attends Banker Meetings: Not on file  . Marital  Status: Not on file  Intimate Partner Violence:   . Fear of Current or Ex-Partner: Not on file  . Emotionally Abused: Not on file  . Physically Abused: Not on file  . Sexually Abused: Not on file     Physical Exam   Vitals:   06/10/20 2032  BP: (!) 145/96  Pulse: 90  Resp: 16  Temp: 99.6 F (37.6 C)  SpO2: 94%    CONSTITUTIONAL: Well-appearing, NAD NEURO:  Alert and oriented x 3, no focal deficits EYES:  eyes equal and reactive ENT/NECK:  no LAD, no JVD CARDIO: Regular rate, well-perfused, normal S1 and S2 PULM:  CTAB no wheezing or rhonchi GI/GU:  normal bowel sounds, non-distended, non-tender, discharge surrounding indwelling catheter MSK/SPINE:  No gross deformities, no edema SKIN:  no rash, atraumatic PSYCH:  Appropriate speech and behavior  *Additional and/or pertinent findings included in MDM  below  Diagnostic and Interventional Summary    EKG Interpretation  Date/Time:    Ventricular Rate:    PR Interval:    QRS Duration:   QT Interval:    QTC Calculation:   R Axis:     Text Interpretation:        Labs Reviewed  URINALYSIS, ROUTINE W REFLEX MICROSCOPIC - Abnormal; Notable for the following components:      Result Value   APPearance CLOUDY (*)    Hgb urine dipstick MODERATE (*)    Protein, ur 100 (*)    Leukocytes,Ua LARGE (*)    WBC, UA >50 (*)    Bacteria, UA FEW (*)    All other components within normal limits  URINE CULTURE    No orders to display    Medications - No data to display   Procedures  /  Critical Care Procedures  ED Course and Medical Decision Making  I have reviewed the triage vital signs, the nursing notes, and pertinent available records from the EMR.  Listed above are laboratory and imaging tests that I personally ordered, reviewed, and interpreted and then considered in my medical decision making (see below for details).  Suspect clogged Foley catheter, will replace and send urinalysis.  No systemic signs of illness, no pain.     New Foley bag collecting urine without issue.  Patient is without fever, no suprapubic tenderness, urinalysis with white blood cells, suspect colonization given chronic indwelling Foley will send for culture rather than empirically treat with antibiotics.  Appropriate for discharge.  Elmer Sow. Pilar Plate, MD [facility] Emergency Medicine [facility] [email or web address]  Final Clinical Impressions(s) / ED Diagnoses     ICD-10-CM   1. Foley catheter problem, initial encounter (HCC)  [email or web address]     ED Discharge Orders    None       Discharge Instructions Discussed with and Provided to Patient:     Discharge Instructions     You were evaluated in the Emergency Department and after careful evaluation, we did not find any emergent condition requiring admission or further testing in the  hospital.  Your exam/testing today was overall reassuring.  Your catheter seemed to be clogged.  We have replaced it here in the emergency department.  As discussed, we sent your urine sample for culture and will call you if it test positive for a infection that needs to be treated.  Please return to the Emergency Department if you experience any worsening of your condition.  Thank you for allowing [place] to be a part of your care.  Sabas Sous, MD 06/11/20 [phone]

## 2020-06-11 NOTE — ED Notes (Signed)
[facility] called for transport.  

## 2020-06-11 NOTE — Discharge Instructions (Addendum)
You were evaluated in the Emergency Department and after careful evaluation, we did not find any emergent condition requiring admission or further testing in the hospital.  Your exam/testing today was overall reassuring.  Your catheter seemed to be clogged.  We have replaced it here in the emergency department.  As discussed, we sent your urine sample for culture and will call you if it test positive for a infection that needs to be treated.  Please return to the Emergency Department if you experience any worsening of your condition.  Thank you for allowing [place] to be a part of your care.

## 2020-06-14 LAB — URINE CULTURE: Culture: >=100000 — AB

## 2020-06-15 ENCOUNTER — Telehealth: Payer: Self-pay

## 2020-06-15 NOTE — Telephone Encounter (Signed)
No treatment for [facility] ED 06/11/20 per Sharen Heck PA

## 2020-07-02 ENCOUNTER — Other Ambulatory Visit: Payer: Self-pay | Admitting: Urology

## 2020-07-05 NOTE — Patient Instructions (Addendum)
DUE TO COVID-19 ONLY ONE VISITOR IS ALLOWED TO COME WITH YOU AND STAY IN THE WAITING ROOM ONLY DURING PRE OP AND PROCEDURE DAY OF SURGERY. THE 1 VISITOR  MAY VISIT WITH YOU AFTER SURGERY IN YOUR PRIVATE ROOM DURING VISITING HOURS ONLY!                 Wesley [name]   Your procedure is scheduled on: 07/18/20   Report to [facility] Main  Entrance   Report to admitting at   9:00 AM     Call this number if you have problems the morning of surgery [phone]    Remember: Do not eat food or drink liquids :After Midnight.   BRUSH YOUR TEETH MORNING OF SURGERY AND RINSE YOUR MOUTH OUT, NO CHEWING GUM CANDY OR MINTS.     Take these medicines the morning of surgery with A SIP OF WATER: Metoprolol,Amlodipine, Tamsulosin                                 You may not have any metal on your body including               piercings  Do not wear jewelry,  lotions, powders or deodorant               Men may shave face and neck.   Do not bring valuables to the hospital. [facility] IS NOT             RESPONSIBLE   FOR VALUABLES.  Contacts, dentures or bridgework may not be worn into surgery.       Special Instructions: N/A              Please read over the following fact sheets you were given: _____________________________________________________________________             [facility] - Preparing for Surgery Before surgery, you can play an important role.   Because skin is not sterile, your skin needs to be as free of germs as possible.   You can reduce the number of germs on your skin by washing with CHG (chlorahexidine gluconate) soap before surgery.   CHG is an antiseptic cleaner which kills germs and bonds with the skin to continue killing germs even after washing. Please DO NOT use if you have an allergy to CHG or antibacterial soaps.   If your skin becomes reddened/irritated stop using the CHG and inform your nurse when you arrive at Short Stay.   You may shave your  face/neck.  Please follow these instructions carefully:  1.  Shower with CHG Soap the night before surgery and the  morning of Surgery.  2.  If you choose to wash your hair, wash your hair first as usual with your  normal  shampoo.  3.  After you shampoo, rinse your hair and body thoroughly to remove the  shampoo.                                        4.  Use CHG as you would any other liquid soap.  You can apply chg directly  to the skin and wash                       Gently with a scrungie or  clean washcloth.  5.  Apply the CHG Soap to your body ONLY FROM THE NECK DOWN.   Do not use on face/ open                           Wound or open sores. Avoid contact with eyes, ears mouth and genitals (private parts).                       Wash face,  Genitals (private parts) with your normal soap.             6.  Wash thoroughly, paying special attention to the area where your surgery  will be performed.  7.  Thoroughly rinse your body with warm water from the neck down.  8.  DO NOT shower/wash with your normal soap after using and rinsing off  the CHG Soap.             9.  Pat yourself dry with a clean towel.            10.  Wear clean pajamas.            11.  Place clean sheets on your bed the night of your first shower and do not  sleep with pets. Day of Surgery : Do not apply any lotions/deodorants the morning of surgery.  Please wear clean clothes to the hospital/surgery center.  FAILURE TO FOLLOW THESE INSTRUCTIONS MAY RESULT IN THE CANCELLATION OF YOUR SURGERY PATIENT SIGNATURE_________________________________  NURSE SIGNATURE__________________________________  ________________________________________________________________________

## 2020-07-09 ENCOUNTER — Other Ambulatory Visit: Payer: Self-pay

## 2020-07-09 ENCOUNTER — Encounter (HOSPITAL_COMMUNITY)
Admission: RE | Admit: 2020-07-09 | Discharge: 2020-07-09 | Disposition: A | Discharge status: Home / Self Care | Payer: Medicaid Other | Source: Ambulatory Visit | Attending: Urology | Admitting: Urology

## 2020-07-09 ENCOUNTER — Encounter (HOSPITAL_COMMUNITY): Payer: Self-pay

## 2020-07-09 DIAGNOSIS — I69354 Hemiplegia and hemiparesis following cerebral infarction affecting left non-dominant side: Secondary | ICD-10-CM | POA: Insufficient documentation

## 2020-07-09 DIAGNOSIS — I129 Hypertensive chronic kidney disease with stage 1 through stage 4 chronic kidney disease, or unspecified chronic kidney disease: Secondary | ICD-10-CM | POA: Diagnosis not present

## 2020-07-09 DIAGNOSIS — Z79899 Other long term (current) drug therapy: Secondary | ICD-10-CM | POA: Insufficient documentation

## 2020-07-09 DIAGNOSIS — R339 Retention of urine, unspecified: Secondary | ICD-10-CM | POA: Insufficient documentation

## 2020-07-09 DIAGNOSIS — N189 Chronic kidney disease, unspecified: Secondary | ICD-10-CM | POA: Insufficient documentation

## 2020-07-09 DIAGNOSIS — Z01818 Encounter for other preprocedural examination: Secondary | ICD-10-CM | POA: Diagnosis not present

## 2020-07-09 HISTORY — DX: Essential (primary) hypertension: I10

## 2020-07-09 LAB — COMPREHENSIVE METABOLIC PANEL
ALT: 15 U/L (ref 0–44)
AST: 10 U/L — ABNORMAL LOW (ref 15–41)
Albumin: 4.1 g/dL (ref 3.5–5.0)
Alkaline Phosphatase: 57 U/L (ref 38–126)
Anion gap: 11 (ref 5–15)
BUN: 11 mg/dL (ref 8–23)
CO2: 25 mmol/L (ref 22–32)
Calcium: 8.9 mg/dL (ref 8.9–10.3)
Chloride: 96 mmol/L — ABNORMAL LOW (ref 98–111)
Creatinine, Ser: 0.98 mg/dL (ref 0.61–1.24)
GFR, Estimated: >60 mL/min (ref >60)
Glucose, Bld: 101 mg/dL — ABNORMAL HIGH (ref 70–99)
Potassium: 4 mmol/L (ref 3.5–5.1)
Sodium: 132 mmol/L — ABNORMAL LOW (ref 135–145)
Total Bilirubin: 1 mg/dL (ref 0.3–1.2)
Total Protein: 7.7 g/dL (ref 6.5–8.1)

## 2020-07-09 LAB — CBC
HCT: 42.4 % (ref 39.0–52.0)
Hemoglobin: 14.2 g/dL (ref 13.0–17.0)
MCH: 28 pg (ref 26.0–34.0)
MCHC: 33.5 g/dL (ref 30.0–36.0)
MCV: 83.6 fL (ref 80.0–100.0)
Platelets: 284 10*3/uL (ref 150–400)
RBC: 5.07 MIL/uL (ref 4.22–5.81)
RDW: 13.7 % (ref 11.5–15.5)
WBC: 5.3 10*3/uL (ref 4.0–10.5)
nRBC: 0 % (ref 0.0–0.2)

## 2020-07-10 NOTE — Anesthesia Preprocedure Evaluation (Addendum)
Anesthesia Evaluation  Patient identified by MRN, date of birth, ID band Patient awake    Reviewed: Allergy & Precautions, NPO status , Patient's Chart, lab work & pertinent test results, reviewed documented beta blocker date and time   History of Anesthesia Complications Negative for: history of anesthetic complications  Airway Mallampati: II  TM Distance: >3 FB Neck ROM: Full    Dental  (+) Missing   Pulmonary neg pulmonary ROS,    Pulmonary exam normal        Cardiovascular hypertension, Pt. on medications and Pt. on home beta blockers Normal cardiovascular exam     Neuro/Psych Seizures -,  CVA, Residual Symptoms negative psych ROS   GI/Hepatic negative GI ROS, Neg liver ROS,   Endo/Other  negative endocrine ROS  Renal/GU Renal InsufficiencyRenal disease Bladder dysfunction (urinary retention)      Musculoskeletal negative musculoskeletal ROS (+)   Abdominal   Peds  Hematology negative hematology ROS (+)   Anesthesia Other Findings   Reproductive/Obstetrics                           Anesthesia Physical Anesthesia Plan  ASA: III  Anesthesia Plan: General   Post-op Pain Management:    Induction: Intravenous  PONV Risk Score and Plan: 2 and Ondansetron, Dexamethasone, Midazolam and Treatment may vary due to age or medical condition  Airway Management Planned: LMA  Additional Equipment: None  Intra-op Plan:   Post-operative Plan: Extubation in OR  Informed Consent: I have reviewed the patients History and Physical, chart, labs and discussed the procedure including the risks, benefits and alternatives for the proposed anesthesia with the patient or authorized representative who has indicated his/her understanding and acceptance.     Dental advisory given  Plan Discussed with:   Anesthesia Plan Comments:       Anesthesia Quick Evaluation

## 2020-07-10 NOTE — Progress Notes (Signed)
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 Uses motorized wheelchair   VS: BP 125/75   Pulse (!) 56   Temp 37.1 C   Resp 20   Ht 5\' 11"  (1.803 m)   Wt 117.9 kg   SpO2 98%   BMI 36.26 kg/m    PROVIDERS: - PCP is , FNP   LABS: Labs reviewed: Acceptable for surgery. (all labs ordered are listed, but only abnormal results are displayed)  Labs Reviewed  COMPREHENSIVE METABOLIC PANEL - Abnormal; Notable for the following components:      Result Value   Sodium 132 (*)    Chloride 96 (*)    Glucose, Bld 101 (*)    AST 10 (*)    All other components within normal limits  CBC  TYPE AND SCREEN     EKG Feb 17, 2059: Sinus bradycardia. LAD. LAFB. RBBB.    CV: N/A   Past Medical History:  Diagnosis Date  . Abnormality of gait 04/18/2014  . BPH (benign prostatic hyperplasia)   . Brainstem stroke (HCC) 2010   , left side weak  . Chronic renal insufficiency   . CKD (chronic kidney disease)   . Dyslipidemia   . Foot drop, left 08/21/2014  . Gait disorder   . 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 Hypertension   . Seizures (HCC)   . Ventricular shunt in place 2010    Past Surgical History:  Procedure Laterality Date  . athroscopic surgery knee Left   . CAROTID ENDARTERECTOMY    . umilical hernia repair      MEDICATIONS: . acetaminophen (TYLENOL) 500 MG tablet  . alum & mag hydroxide-simeth (MAALOX/MYLANTA) 200-200-20 MG/5ML suspension  . amLODipine (NORVASC) 10 MG tablet  . baclofen (LIORESAL) 20 MG tablet  . cephALEXin (KEFLEX) 500 MG capsule  .  guaifenesin (ROBITUSSIN) 100 MG/5ML syrup  . loperamide (IMODIUM) 2 MG capsule  . magnesium citrate SOLN  . magnesium hydroxide (MILK OF MAGNESIA) 400 MG/5ML suspension  . metoprolol succinate (TOPROL-XL) 50 MG 24 hr tablet  . metoprolol tartrate (LOPRESSOR) 100 MG tablet  . Multiple Vitamin (MULTIVITAMIN) tablet  . Neomycin-Bacitracin-Polymyxin (TRIPLE ANTIBIOTIC) [phone] OINT  . Polyethyl Glycol-Propyl Glycol (SYSTANE ULTRA) 0.4-0.3 % SOLN  . polyvinyl alcohol (LIQUIFILM TEARS) 1.4 % ophthalmic solution  . tamsulosin (FLOMAX) 0.4 MG CAPS capsule  . traZODone (DESYREL) 100 MG tablet  . zolpidem (AMBIEN) 5 MG tablet   No current facility-administered medications for this encounter.    If no changes, I anticipate pt can proceed with surgery as scheduled.   03-05-1993, PhD, FNP-BC [facility]/Anesthesiology Phone: [phone] 07/10/2020 2:42 PM

## 2020-07-17 MED ORDER — GENTAMICIN SULFATE 40 MG/ML IJ SOLN
5.0000 mg/kg | INTRAVENOUS | Status: AC
  Administered 2020-07-18: 460 mg via INTRAVENOUS
  Filled 2020-07-17: qty 11.5

## 2020-07-18 ENCOUNTER — Ambulatory Visit (HOSPITAL_COMMUNITY): Payer: Medicaid Other | Admitting: Certified Registered"

## 2020-07-18 ENCOUNTER — Observation Stay (HOSPITAL_COMMUNITY)
Admission: RE | Admit: 2020-07-18 | Discharge: 2020-07-19 | Disposition: A | Discharge status: Home / Self Care | Payer: Medicaid Other | Source: Ambulatory Visit | Attending: Urology | Admitting: Urology

## 2020-07-18 ENCOUNTER — Ambulatory Visit (HOSPITAL_COMMUNITY): Payer: Medicaid Other | Admitting: Emergency Medicine

## 2020-07-18 ENCOUNTER — Encounter (HOSPITAL_COMMUNITY)
Admission: RE | Disposition: A | Discharge status: Home / Self Care | Payer: Self-pay | Source: Ambulatory Visit | Attending: Urology

## 2020-07-18 ENCOUNTER — Other Ambulatory Visit: Payer: Self-pay

## 2020-07-18 ENCOUNTER — Encounter (HOSPITAL_COMMUNITY): Payer: Self-pay | Admitting: Urology

## 2020-07-18 DIAGNOSIS — N139 Obstructive and reflux uropathy, unspecified: Secondary | ICD-10-CM | POA: Diagnosis not present

## 2020-07-18 DIAGNOSIS — N320 Bladder-neck obstruction: Secondary | ICD-10-CM

## 2020-07-18 DIAGNOSIS — Z20822 Contact with and (suspected) exposure to covid-19: Secondary | ICD-10-CM | POA: Diagnosis not present

## 2020-07-18 DIAGNOSIS — R339 Retention of urine, unspecified: Principal | ICD-10-CM | POA: Insufficient documentation

## 2020-07-18 DIAGNOSIS — I10 Essential (primary) hypertension: Secondary | ICD-10-CM | POA: Diagnosis not present

## 2020-07-18 DIAGNOSIS — Z79899 Other long term (current) drug therapy: Secondary | ICD-10-CM | POA: Diagnosis not present

## 2020-07-18 HISTORY — PX: TRANSURETHRAL RESECTION OF PROSTATE: SHX73

## 2020-07-18 LAB — RESPIRATORY PANEL BY RT PCR (FLU A&B, COVID)
Influenza A by PCR: NEGATIVE
Influenza B by PCR: NEGATIVE
SARS Coronavirus 2 by RT PCR: NEGATIVE

## 2020-07-18 LAB — TYPE AND SCREEN
ABO/RH(D): O POS
Antibody Screen: NEGATIVE

## 2020-07-18 LAB — ABO/RH: ABO/RH(D): O POS

## 2020-07-18 SURGERY — TURP (TRANSURETHRAL RESECTION OF PROSTATE)
Anesthesia: General

## 2020-07-18 MED ORDER — FENTANYL CITRATE (PF) 100 MCG/2ML IJ SOLN: 25.0000 ug | INTRAMUSCULAR | Status: DC | PRN

## 2020-07-18 MED ORDER — MIDAZOLAM HCL 2 MG/2ML IJ SOLN
INTRAMUSCULAR | Status: AC
  Filled 2020-07-18: qty 2

## 2020-07-18 MED ORDER — MIDAZOLAM HCL 5 MG/5ML IJ SOLN
INTRAMUSCULAR | Status: DC | PRN
  Administered 2020-07-18: 1 mg via INTRAVENOUS

## 2020-07-18 MED ORDER — LACTATED RINGERS IV SOLN: INTRAVENOUS | Status: DC

## 2020-07-18 MED ORDER — SODIUM CHLORIDE 0.45 % IV SOLN: INTRAVENOUS | Status: DC

## 2020-07-18 MED ORDER — ACETAMINOPHEN 10 MG/ML IV SOLN
INTRAVENOUS | Status: AC
  Filled 2020-07-18: qty 100

## 2020-07-18 MED ORDER — ACETAMINOPHEN 500 MG PO TABS: 500.0000 mg | ORAL_TABLET | ORAL | Status: DC | PRN

## 2020-07-18 MED ORDER — OXYCODONE HCL 5 MG/5ML PO SOLN: 5.0000 mg | Freq: Once | ORAL | Status: DC | PRN

## 2020-07-18 MED ORDER — SODIUM CHLORIDE 0.9 % IR SOLN: 3000.0000 mL | Status: DC

## 2020-07-18 MED ORDER — TRAMADOL HCL 50 MG PO TABS: 50.0000 mg | ORAL_TABLET | Freq: Four times a day (QID) | ORAL | Status: DC | PRN

## 2020-07-18 MED ORDER — EPHEDRINE SULFATE-NACL 50-0.9 MG/10ML-% IV SOSY
PREFILLED_SYRINGE | INTRAVENOUS | Status: DC | PRN
  Administered 2020-07-18: 10 mg via INTRAVENOUS

## 2020-07-18 MED ORDER — DOCUSATE SODIUM 100 MG PO CAPS
100.0000 mg | ORAL_CAPSULE | Freq: Two times a day (BID) | ORAL | Status: DC
  Administered 2020-07-18 – 2020-07-19 (×2): 100 mg via ORAL
  Filled 2020-07-18 (×2): qty 1

## 2020-07-18 MED ORDER — ACETAMINOPHEN 10 MG/ML IV SOLN
INTRAVENOUS | Status: DC | PRN
  Administered 2020-07-18: 1000 mg via INTRAVENOUS

## 2020-07-18 MED ORDER — 0.9 % SODIUM CHLORIDE (POUR BTL) OPTIME
TOPICAL | Status: DC | PRN
  Administered 2020-07-18: 1000 mL

## 2020-07-18 MED ORDER — BACLOFEN 20 MG PO TABS
20.0000 mg | ORAL_TABLET | Freq: Three times a day (TID) | ORAL | Status: DC
  Administered 2020-07-18 – 2020-07-19 (×2): 20 mg via ORAL
  Filled 2020-07-18 (×2): qty 1

## 2020-07-18 MED ORDER — CHLORHEXIDINE GLUCONATE CLOTH 2 % EX PADS: 6.0000 | MEDICATED_PAD | Freq: Every day | CUTANEOUS | Status: DC

## 2020-07-18 MED ORDER — ALUM & MAG HYDROXIDE-SIMETH 200-200-20 MG/5ML PO SUSP: 30.0000 mL | ORAL | Status: DC | PRN

## 2020-07-18 MED ORDER — DEXAMETHASONE SODIUM PHOSPHATE 10 MG/ML IJ SOLN
INTRAMUSCULAR | Status: DC | PRN
  Administered 2020-07-18: 10 mg via INTRAVENOUS

## 2020-07-18 MED ORDER — PROPOFOL 10 MG/ML IV BOLUS
INTRAVENOUS | Status: AC
  Filled 2020-07-18: qty 20

## 2020-07-18 MED ORDER — SODIUM CHLORIDE 0.9 % IR SOLN
Status: DC | PRN
  Administered 2020-07-18: 42000 mL

## 2020-07-18 MED ORDER — TRAZODONE HCL 100 MG PO TABS
100.0000 mg | ORAL_TABLET | Freq: Every day | ORAL | Status: DC
  Administered 2020-07-18: 100 mg via ORAL
  Filled 2020-07-18: qty 1

## 2020-07-18 MED ORDER — METOPROLOL SUCCINATE ER 50 MG PO TB24
50.0000 mg | ORAL_TABLET | Freq: Every day | ORAL | Status: DC
  Administered 2020-07-19: 50 mg via ORAL
  Filled 2020-07-18: qty 1

## 2020-07-18 MED ORDER — LIDOCAINE 2% (20 MG/ML) 5 ML SYRINGE
INTRAMUSCULAR | Status: DC | PRN
  Administered 2020-07-18: 100 mg via INTRAVENOUS

## 2020-07-18 MED ORDER — HEPARIN SODIUM (PORCINE) 5000 UNIT/ML IJ SOLN
5000.0000 [IU] | Freq: Three times a day (TID) | INTRAMUSCULAR | Status: DC
  Administered 2020-07-18 – 2020-07-19 (×2): 5000 [IU] via SUBCUTANEOUS
  Filled 2020-07-18 (×2): qty 1

## 2020-07-18 MED ORDER — ZOLPIDEM TARTRATE 5 MG PO TABS
5.0000 mg | ORAL_TABLET | Freq: Every day | ORAL | Status: DC
  Administered 2020-07-18: 5 mg via ORAL
  Filled 2020-07-18: qty 1

## 2020-07-18 MED ORDER — ONDANSETRON HCL 4 MG/2ML IJ SOLN: 4.0000 mg | Freq: Four times a day (QID) | INTRAMUSCULAR | Status: DC | PRN

## 2020-07-18 MED ORDER — ORAL CARE MOUTH RINSE
15.0000 mL | Freq: Once | OROMUCOSAL | Status: AC
  Administered 2020-07-18: 15 mL via OROMUCOSAL

## 2020-07-18 MED ORDER — ONDANSETRON HCL 4 MG/2ML IJ SOLN: 4.0000 mg | Freq: Once | INTRAMUSCULAR | Status: DC | PRN

## 2020-07-18 MED ORDER — PROPOFOL 10 MG/ML IV BOLUS
INTRAVENOUS | Status: DC | PRN
  Administered 2020-07-18 (×2): 20 mg via INTRAVENOUS
  Administered 2020-07-18: 150 mg via INTRAVENOUS
  Administered 2020-07-18: 50 mg via INTRAVENOUS
  Administered 2020-07-18 (×2): 20 mg via INTRAVENOUS
  Administered 2020-07-18: 50 mg via INTRAVENOUS

## 2020-07-18 MED ORDER — AMLODIPINE BESYLATE 10 MG PO TABS
10.0000 mg | ORAL_TABLET | Freq: Every day | ORAL | Status: DC
  Administered 2020-07-19: 10 mg via ORAL
  Filled 2020-07-18: qty 1

## 2020-07-18 MED ORDER — AMISULPRIDE (ANTIEMETIC) 5 MG/2ML IV SOLN: 10.0000 mg | Freq: Once | INTRAVENOUS | Status: DC | PRN

## 2020-07-18 MED ORDER — FENTANYL CITRATE (PF) 100 MCG/2ML IJ SOLN
INTRAMUSCULAR | Status: DC | PRN
  Administered 2020-07-18 (×2): 25 ug via INTRAVENOUS
  Administered 2020-07-18: 50 ug via INTRAVENOUS

## 2020-07-18 MED ORDER — ESMOLOL HCL 100 MG/10ML IV SOLN
INTRAVENOUS | Status: DC | PRN
  Administered 2020-07-18: 20 mg via INTRAVENOUS

## 2020-07-18 MED ORDER — FENTANYL CITRATE (PF) 100 MCG/2ML IJ SOLN
INTRAMUSCULAR | Status: AC
  Filled 2020-07-18: qty 2

## 2020-07-18 MED ORDER — ONDANSETRON HCL 4 MG/2ML IJ SOLN
INTRAMUSCULAR | Status: AC
  Filled 2020-07-18: qty 2

## 2020-07-18 MED ORDER — POLYVINYL ALCOHOL 1.4 % OP SOLN
1.0000 [drp] | Freq: Three times a day (TID) | OPHTHALMIC | Status: DC | PRN
  Filled 2020-07-18: qty 15

## 2020-07-18 MED ORDER — CHLORHEXIDINE GLUCONATE 0.12 % MT SOLN: 15.0000 mL | Freq: Once | OROMUCOSAL | Status: AC

## 2020-07-18 MED ORDER — OXYCODONE HCL 5 MG PO TABS: 5.0000 mg | ORAL_TABLET | Freq: Once | ORAL | Status: DC | PRN

## 2020-07-18 MED ORDER — ONDANSETRON HCL 4 MG/2ML IJ SOLN
INTRAMUSCULAR | Status: DC | PRN
  Administered 2020-07-18: 4 mg via INTRAVENOUS

## 2020-07-18 SURGICAL SUPPLY — 20 items
BAG DRN RND TRDRP ANRFLXCHMBR (UROLOGICAL SUPPLIES) ×1
BAG URINE DRAIN 2000ML AR STRL (UROLOGICAL SUPPLIES) ×3 IMPLANT
BAG URO CATCHER STRL LF (MISCELLANEOUS) ×3 IMPLANT
CATH FOLEY 3WAY 30CC 22FR (CATHETERS) IMPLANT
CATH FOLEY 3WAY 30CC 24FR (CATHETERS) ×3
CATH URTH STD 24FR FL 3W 2 (CATHETERS) ×1 IMPLANT
ELECT REM PT RETURN 15FT ADLT (MISCELLANEOUS) ×3 IMPLANT
GLOVE BIO SURGEON STRL SZ7.5 (GLOVE) ×3 IMPLANT
GOWN STRL REUS W/TWL XL LVL3 (GOWN DISPOSABLE) ×3 IMPLANT
HOLDER FOLEY CATH W/STRAP (MISCELLANEOUS) IMPLANT
KIT TURNOVER KIT A (KITS) IMPLANT
LOOP CUT BIPOLAR 24F LRG (ELECTROSURGICAL) ×6 IMPLANT
MANIFOLD NEPTUNE II (INSTRUMENTS) ×3 IMPLANT
PACK CYSTO (CUSTOM PROCEDURE TRAY) ×3 IMPLANT
SYR 30ML LL (SYRINGE) IMPLANT
SYR TOOMEY IRRIG 70ML (MISCELLANEOUS) ×3
SYRINGE TOOMEY IRRIG 70ML (MISCELLANEOUS) ×1 IMPLANT
TUBING CONNECTING 10 (TUBING) ×2 IMPLANT
TUBING CONNECTING 10' (TUBING) ×1
TUBING UROLOGY SET (TUBING) ×3 IMPLANT

## 2020-07-18 NOTE — Transfer of Care (Signed)
Immediate Anesthesia Transfer of Care Note  Patient: Wesley [name]  Procedure(s) Performed: TRANSURETHRAL RESECTION OF THE PROSTATE (TURP) (N/A )  Patient Location: PACU  Anesthesia Type:General  Level of Consciousness: drowsy, patient cooperative and responds to stimulation  Airway & Oxygen Therapy: Patient Spontanous Breathing and Patient connected to face mask oxygen  Post-op Assessment: Report given to RN and Post -op Vital signs reviewed and stable  Post vital signs: Reviewed and stable  Last Vitals:  Vitals Value Taken Time  BP 101/68 07/18/20 1602  Temp    Pulse 84 07/18/20 1604  Resp 18 07/18/20 1604  SpO2 97 % 07/18/20 1604  Vitals shown include unvalidated device data.  Last Pain:  Vitals:   07/18/20 0853  TempSrc:   PainSc: 0-No pain         Complications: No complications documented.

## 2020-07-18 NOTE — Interval H&P Note (Signed)
History and Physical Interval Note:  07/18/2020 1:35 PM  Wesley [name]  has presented today for surgery, with the diagnosis of URINARY RETENTION.  The various methods of treatment have been discussed with the patient and family. After consideration of risks, benefits and other options for treatment, the patient has consented to  Procedure(s): TRANSURETHRAL RESECTION OF THE PROSTATE (TURP) (N/A) as a surgical intervention.  The patient's history has been reviewed, patient examined, no change in status, stable for surgery.  I have reviewed the patient's chart and labs.  Questions were answered to the patient's satisfaction.     Crist Fat

## 2020-07-18 NOTE — Op Note (Signed)
Preoperative diagnosis:  1. Bladder outlet obstruction 2. Urinary retention  Postoperative diagnosis:  1. same   Procedure:  1. Transurethral resection of prostate  Surgeon: Crist Fat, MD   Anesthesia: General   Complications: None   Intraoperative findings: Cystoscopy demonstrated a normal appearing bladder mucosa without tumor/stones/or abnormalities.  UOs were orthopic.  Prostate demonstrated a large median lobe with lateral lobe obstruction.  EBL: 100cc   Specimens: Prostate chips  Indication: Wesley [name] is a 59 y.o. patient with bladder outlet obstruction/urinary retention/obstructive voiding symptoms.  After reviewing the management options for treatment, he elected to proceed with the above surgical procedure(s). We have discussed the potential benefits and risks of the procedure, side effects of the proposed treatment, the likelihood of the patient achieving the goals of the procedure, and any potential problems that might occur during the procedure or recuperation. Informed consent has been obtained.  Description of procedure:  The patient was taken to the operating room and general anesthesia was induced. The patient was placed in the dorsal lithotomy position, prepped and draped in the usual sterile fashion, and preoperative antibiotics were administered. A preoperative time-out was performed.   I then gently passed the 21 French 30 cystoscope into the patient's urethra and up into the bladder under visual guidance. A 360 cystoscopic evaluation was then performed with the above findings.  I then removed the 21 French cystoscopic sheath and replacement with a 26 French resectoscope sheath was passed and using the visual obturator under direct vision. An exchange the obturator for the resectoscope itself and the loop cautery. I then proceeded to create grooves within the prostate at the 7:00 position extending the defect from the bladder neck at 7:00 down to the  prostate apex just lateral to the verumontanum. I took this groove down to the capsule. I then performed a similar maneuver on the patient's left side at the 5:00 position taking the prostate down from the bladder neck to the apex and down the prostatic capsule. At this point I proceeded to resect the patient's right lateral lobe in a systematic fashion moving from the 7:00 position up to approximately 11. The resection was taken down to the prostate capsule. Hemostasis was achieved on this side prior to moving to the patient's left lateral lobe. A similar sequence was performed taking the prostate down from the 5:00 position to approximately the 1:00 position to the level of the prostatic capsule. I then completed the resection of the posterior wall as well as the area between 5:00 and 7:00 along the bladder neck. I was careful not to undermine the bladder neck or resect the inferior. I then did resect the area that was protruding into the prostatic urethra anteriorly.   Once I was satisfied that the prostate had been adequately resected I evacuated the prostate chips using a To[place]y syringe. I then reintroduced the resectoscope and ensured adequate hemostasis. I then left the bladder full and removed the resectoscope entirely. Exam under anesthesia demonstrated a normal sized prostate with no nodules.  I then placed a 22 French three-way Foley catheter. I irrigated the catheter gently and removed all debris and clots. I then placed the patient on gentle continuous bladder irrigation.  He subsequently extubated and returned to PACU in excellent condition.  Crist Fat, MD

## 2020-07-18 NOTE — Anesthesia Postprocedure Evaluation (Signed)
Anesthesia Post Note  Patient: Wesley [name]  Procedure(s) Performed: TRANSURETHRAL RESECTION OF THE PROSTATE (TURP) (N/A )     Patient location during evaluation: PACU Anesthesia Type: General Level of consciousness: awake and alert Pain management: pain level controlled Vital Signs Assessment: post-procedure vital signs reviewed and stable Respiratory status: spontaneous breathing, nonlabored ventilation and respiratory function stable Cardiovascular status: blood pressure returned to baseline and stable Postop Assessment: no apparent nausea or vomiting Anesthetic complications: no   No complications documented.  Last Vitals:  Vitals:   07/18/20 1720 07/18/20 1724  BP: 120/80 120/80  Pulse: 69 66  Resp: 14 14  Temp: 36.6 C 36.6 C  SpO2: 100% 100%    Last Pain:  Vitals:   07/18/20 1724  TempSrc: Oral  PainSc:                  Lucretia Kern

## 2020-07-18 NOTE — Anesthesia Procedure Notes (Addendum)
Procedure Name: Intubation Date/Time: 07/18/2020 2:10 PM Performed by: Jorene Minors, CRNA Pre-anesthesia Checklist: Patient identified, Emergency Drugs available, Suction available and Patient being monitored Patient Re-evaluated:Patient Re-evaluated prior to induction Oxygen Delivery Method: Circle system utilized Preoxygenation: Pre-oxygenation with 100% oxygen Induction Type: IV induction LMA: LMA inserted LMA Size: 5.0 Tube type: Oral Number of attempts: 1 Airway Equipment and Method: Oral airway Placement Confirmation: positive ETCO2 and breath sounds checked- equal and bilateral Tube secured with: Tape Dental Injury: Teeth and Oropharynx as per pre-operative assessment

## 2020-07-18 NOTE — H&P (Signed)
59 year old male with a history of urinary retention. He was initially evaluated in 2018 when he was having leakage and urinary frequency. He was subsequently lost to follow-up but re-presented to the emergency department in urinary retention. Foley catheter was placed in the patient is subsequently failed several voiding trials. The patient underwent a urodynamic evaluation which demonstrated a large capacity bladder with a fairly weak but adequate bladder contraction. He was unable to void at the time of urodynamics.   The patient's urodynamics also demonstrated an elevated bladder neck and mild diverticulum of the bladder.   The patient has a history of a hemorrhagic stroke. He resides in a nursing facility. He does not have a history of diabetes. He is limited in his mobility with left-sided hemi paresis.     ALLERGIES: None   MEDICATIONS: Metoprolol Succinate 50 mg tablet, extended release 24 hr  Tamsulosin Hcl 0.4 mg capsule 1 capsule PO BID  Acetaminophen  Amlodipine Besylate 10 mg tablet  Baclofen 20 mg tablet  Loperamide 2 mg tablet  Magnesium Hydroxide  Melatonin  Multivitamin  Robafen 100 mg/5 ml liquid  Systane  Trazodone Hcl 100 mg tablet  Tylenol  Zolpidem Tartrate 10 mg tablet     GU PSH: Complex cystometrogram, w/ void pressure and urethral pressure profile studies, any technique - 06/05/2020 Complex Uroflow - 06/05/2020 Emg surf Electrd - 06/05/2020 Inject For cystogram - 06/05/2020 Intrabd voidng Press - 06/05/2020     NON-GU PSH: Hernia Repair - about 1988     GU PMH: Urinary Retention - 06/05/2020, - 05/03/2020 Obstructive and reflux uropathy, Unspec - 11/16/2019 BPH w/o LUTS - 2018 Overactive bladder - 2018    NON-GU PMH: Hypertension Stroke/TIA    FAMILY HISTORY: Family History Unknown    SOCIAL HISTORY: Marital Status: Divorced Preferred Language: English; Ethnicity: Not Hispanic Or Latino; Race: Black or African American Current Smoking Status:  Patient has never smoked.   Tobacco Use Assessment Completed: Used Tobacco in last 30 days? Has never drank.  Drinks 1 caffeinated drink per day. Patient's occupation is/was disabled.    REVIEW OF SYSTEMS:    GU Review Male:   Patient denies frequent urination, hard to postpone urination, burning/ pain with urination, get up at night to urinate, leakage of urine, stream starts and stops, trouble starting your stream, have to strain to urinate , erection problems, and penile pain.  Gastrointestinal (Upper):   Patient denies nausea, vomiting, and indigestion/ heartburn.  Gastrointestinal (Lower):   Patient reports constipation. Patient denies diarrhea.  Constitutional:   Patient denies fever, night sweats, weight loss, and fatigue.  Skin:   Patient denies skin rash/ lesion and itching.  Eyes:   Patient denies blurred vision and double vision.  Ears/ Nose/ Throat:   Patient denies sore throat and sinus problems.  Hematologic/Lymphatic:   Patient denies swollen glands and easy bruising.  Cardiovascular:   Patient denies leg swelling and chest pains.  Respiratory:   Patient denies cough and shortness of breath.  Endocrine:   Patient denies excessive thirst.  Musculoskeletal:   Patient denies joint pain and back pain.  Neurological:   Patient denies headaches and dizziness.  Psychologic:   Patient denies depression and anxiety.   Notes: Patient has foley catheter    VITAL SIGNS:      06/22/2020 09:22 AM  Weight 260 lb / 117.93 kg  BP 114/76 mmHg  Pulse 59 /min  Temperature 97.3 F / 36.2 C   MULTI-SYSTEM PHYSICAL EXAMINATION:  Constitutional: Well-nourished. No physical deformities. Normally developed. Good grooming.   Respiratory: Normal breath sounds. No labored breathing, no use of accessory muscles.   Cardiovascular: Regular rate and rhythm. No murmur, no gallop. Normal temperature, normal extremity pulses, no swelling, no varicosities.      Complexity of Data:  Source Of  History:  Patient  Records Review:   Previous Doctor Records, Previous Patient Records  Urine Test Review:   Urinalysis  Urodynamics Review:   Review Urodynamics Tests   11/17/19  PSA  Total PSA 6.01 ng/mL    PROCEDURES:         Simple Foley Indwelling Cath Change - 51702  The patient's indwelling foley tube was carefully removed. A 16 French Foley catheter was inserted into the bladder using sterile technique. The patient was taught routine catheter care. A leg bag was connected. 20 cc of urine was obtained. A urine culture was sent to the lab.   ASSESSMENT:      ICD-10 Details  1 GU:   Obstructive and reflux uropathy, Unspec - N13.9   2   Urinary Retention - R33.8   3   BPH w/o LUTS - N40.0    PLAN:           Orders Labs Urine Culture          Schedule Return Visit/Planned Activity: ASAP - Schedule Surgery          Document Letter(s):  Created for Patient: Clinical Summary         Notes:   The patient has urinary retention from obstructive prostate. His bladder does work, but isn't very strong. I went through the urodynamic evaluation with the patient. I think he would be best served with an aggressive TURP. We discussed the fact that following the surgery he may not void right away, and that surgery may be unsuccessful ultimately, but we need to proceed with that. If we will do the surgery he will likely be stuck with a catheter for ever. I went through the surgery with him in detail. Will patient understands that this will be an overnight observation surgery. Will try to get that scheduled as quickly as possible. We will take a urine culture today and changes Foley catheter as well.

## 2020-07-19 ENCOUNTER — Encounter (HOSPITAL_COMMUNITY): Payer: Self-pay | Admitting: Urology

## 2020-07-19 DIAGNOSIS — R339 Retention of urine, unspecified: Secondary | ICD-10-CM | POA: Diagnosis not present

## 2020-07-19 LAB — CBC
HCT: 35.5 % — ABNORMAL LOW (ref 39.0–52.0)
Hemoglobin: 11.7 g/dL — ABNORMAL LOW (ref 13.0–17.0)
MCH: 27.6 pg (ref 26.0–34.0)
MCHC: 33 g/dL (ref 30.0–36.0)
MCV: 83.7 fL (ref 80.0–100.0)
Platelets: 264 10*3/uL (ref 150–400)
RBC: 4.24 MIL/uL (ref 4.22–5.81)
RDW: 13.5 % (ref 11.5–15.5)
WBC: 6.1 10*3/uL (ref 4.0–10.5)
nRBC: 0 % (ref 0.0–0.2)

## 2020-07-19 LAB — BASIC METABOLIC PANEL
Anion gap: 7 (ref 5–15)
BUN: 11 mg/dL (ref 8–23)
CO2: 19 mmol/L — ABNORMAL LOW (ref 22–32)
Calcium: 6.8 mg/dL — ABNORMAL LOW (ref 8.9–10.3)
Chloride: 95 mmol/L — ABNORMAL LOW (ref 98–111)
Creatinine, Ser: 0.83 mg/dL (ref 0.61–1.24)
GFR, Estimated: >60 mL/min (ref >60)
Glucose, Bld: 99 mg/dL (ref 70–99)
Potassium: 3.5 mmol/L (ref 3.5–5.1)
Sodium: 121 mmol/L — ABNORMAL LOW (ref 135–145)

## 2020-07-19 MED ORDER — SULFAMETHOXAZOLE-TRIMETHOPRIM 800-160 MG PO TABS: 1.0000 | ORAL_TABLET | Freq: Two times a day (BID) | ORAL | 0 refills | Status: DC

## 2020-07-19 MED ORDER — SULFAMETHOXAZOLE-TRIMETHOPRIM 800-160 MG PO TABS: 1.0000 | ORAL_TABLET | Freq: Two times a day (BID) | ORAL | 0 refills | Status: AC

## 2020-07-19 NOTE — Discharge Summary (Signed)
Date of admission: 07/18/2020  Date of discharge: 07/19/2020  Admission diagnosis: urinary retention  Discharge diagnosis: same  Secondary diagnoses:  Patient Active Problem List   Diagnosis Date Noted  . Bladder outlet obstruction 07/18/2020  . Subdural hematoma ([place]) 04/26/2018  . Foot drop, left 08/21/2014  . Hemiparesis affecting left side as late effect of cerebrovascular accident ([place]) 04/18/2014  . Abnormality of gait 04/18/2014    Procedures performed: Procedure(s): TRANSURETHRAL RESECTION OF THE PROSTATE (TURP)  History and Physical: For full details, please see admission history and physical. Briefly, Wesley [name] is a 59 y.o. year old patient with chronic urinary retention.   Hospital Course: Patient tolerated the procedure well.  He was then transferred to the floor after an uneventful PACU stay.  His hospital course was uncomplicated.  On POD#1 he had met discharge criteria: was eating a regular diet, was up and ambulating independently,  pain was well controlled, and was ready to for discharge. The patient is being discharged home with a Foley catheter with an appointment for voiding trial on Monday, November 5.  He is also being discharged on Bactrim antibiotic.  PE: NAD Vitals:   07/18/20 2002 07/18/20 2212 07/19/20 0002 07/19/20 0516  BP: 105/68  98/62 105/69  Pulse: 62  (!) 59 (!) 56  Resp: 18  15 15   Temp: 98.2 F (36.8 C)  98.1 F (36.7 C) 98.3 F (36.8 C)  TempSrc: Oral  Oral Oral  SpO2: 98%  99% 98%  Weight:  111.6 kg    Height:  5' 11"  (1.803 m)      Intake/Output Summary (Last 24 hours) at 07/19/2020 1124 Last data filed at 07/19/2020 0900 Gross per 24 hour  Intake 8693.31 ml  Output 10735 ml  Net -2041.69 ml  non-labored breathing Abdomen is soft Ext symmetric  Laboratory values:  Recent Labs    07/19/20 0530  WBC 6.1  HGB 11.7*  HCT 35.5*   Recent Labs    07/19/20 0530  NA 121*  K 3.5  CL 95*  CO2 19*  GLUCOSE 99  BUN 11   CREATININE 0.83  CALCIUM 6.8*   No results for input(s): LABPT, INR in the last 72 hours. No results for input(s): LABURIN in the last 72 hours. Results for orders placed or performed during the hospital encounter of 07/18/20  Respiratory Panel by RT PCR (Flu A&B, Covid) - Nasopharyngeal Swab     Status: None   Collection Time: 07/18/20  8:35 AM   Specimen: Nasopharyngeal Swab  Result Value Ref Range Status   SARS Coronavirus 2 by RT PCR NEGATIVE NEGATIVE Final    Comment: (NOTE) SARS-CoV-2 target nucleic acids are NOT DETECTED.  The SARS-CoV-2 RNA is generally detectable in upper respiratoy specimens during the acute phase of infection. The lowest concentration of SARS-CoV-2 viral copies this assay can detect is 131 copies/mL. A negative result does not preclude SARS-Cov-2 infection and should not be used as the sole basis for treatment or other patient management decisions. A negative result may occur with  improper specimen collection/handling, submission of specimen other than nasopharyngeal swab, presence of viral mutation(s) within the areas targeted by this assay, and inadequate number of viral copies (<131 copies/mL). A negative result must be combined with clinical observations, patient history, and epidemiological information. The expected result is Negative.  Fact Sheet for Patients:  PinkCheek.be  Fact Sheet for Healthcare Providers:  GravelBags.it  This test is no t yet approved or cleared by the [place]  States FDA and  has been authorized for detection and/or diagnosis of SARS-CoV-2 by FDA under an Emergency Use Authorization (EUA). This EUA will remain  in effect (meaning this test can be used) for the duration of the COVID-19 declaration under Section 564(b)(1) of the Act, 21 U.S.C. section 360bbb-3(b)(1), unless the authorization is terminated or revoked sooner.     Influenza A by PCR NEGATIVE  NEGATIVE Final   Influenza B by PCR NEGATIVE NEGATIVE Final    Comment: (NOTE) The Xpert Xpress SARS-CoV-2/FLU/RSV assay is intended as an aid in  the diagnosis of influenza from Nasopharyngeal swab specimens and  should not be used as a sole basis for treatment. Nasal washings and  aspirates are unacceptable for Xpert Xpress SARS-CoV-2/FLU/RSV  testing.  Fact Sheet for Patients: PinkCheek.be  Fact Sheet for Healthcare Providers: GravelBags.it  This test is not yet approved or cleared by the [place] FDA and  has been authorized for detection and/or diagnosis of SARS-CoV-2 by  FDA under an Emergency Use Authorization (EUA). This EUA will remain  in effect (meaning this test can be used) for the duration of the  Covid-19 declaration under Section 564(b)(1) of the Act, 21  U.S.C. section 360bbb-3(b)(1), unless the authorization is  terminated or revoked. Performed at [facility], [place] [address], [place], [place] [address]     Disposition: Home  Discharge instruction: The patient was instructed to be ambulatory but told to refrain from heavy lifting, strenuous activity, or driving.    Discharge medications:  Allergies as of 07/19/2020   No Known Allergies     Medication List    STOP taking these medications   cephALEXin 500 MG capsule Commonly known as: KEFLEX   tamsulosin 0.4 MG Caps capsule Commonly known as: FLOMAX     TAKE these medications   acetaminophen 500 MG tablet Commonly known as: TYLENOL Take 500 mg by mouth every 4 (four) hours as needed for mild pain, fever or headache (Do not exceed 2000 mg).   alum & mag hydroxide-simeth 200-200-20 MG/5ML suspension Commonly known as: MAALOX/MYLANTA Take 30 mLs by mouth as needed for indigestion (do not exceed 4 doses in 24 hours).   amLODipine 10 MG tablet Commonly known as: NORVASC Take 10 mg by mouth daily.   baclofen 20 MG  tablet Commonly known as: LIORESAL Take 1 tablet (20 mg total) by mouth 3 (three) times daily.   guaifenesin 100 MG/5ML syrup Commonly known as: ROBITUSSIN Take 200 mg by mouth every 6 (six) hours as needed for cough (not to exceed 4 doses in 24 hours).   loperamide 2 MG capsule Commonly known as: IMODIUM Take 2 mg by mouth as needed for diarrhea or loose stools (do not exceed 8 doses 24 hours).   magnesium citrate Soln Take 1 Bottle by mouth daily as needed for severe constipation.   magnesium hydroxide 400 MG/5ML suspension Commonly known as: MILK OF MAGNESIA Take 30 mLs by mouth at bedtime as needed for mild constipation.   metoprolol succinate 50 MG 24 hr tablet Commonly known as: TOPROL-XL Take 50 mg by mouth daily. Take with or immediately following a meal.   metoprolol tartrate 100 MG tablet Commonly known as: LOPRESSOR Take 0.5 tablets (50 mg total) by mouth 2 (two) times daily. Hold lopressor if sbp less than 90.   multivitamin tablet Take 1 tablet by mouth daily.   polyvinyl alcohol 1.4 % ophthalmic solution Commonly known as: LIQUIFILM TEARS Place 1 drop into both eyes  3 (three) times daily as needed for dry eyes.   sulfamethoxazole-trimethoprim 800-160 MG tablet Commonly known as: BACTRIM DS Take 1 tablet by mouth 2 (two) times daily for 7 days.   Systane Ultra 0.4-0.3 % Soln Generic drug: Polyethyl Glycol-Propyl Glycol Place 1 drop into both eyes 3 (three) times daily as needed (dry eyes).   traZODone 100 MG tablet Commonly known as: DESYREL Take 100 mg by mouth at bedtime.   Triple Antibiotic [phone] Oint Apply 1 application topically as needed (skin tears).   zolpidem 5 MG tablet Commonly known as: AMBIEN Take 5 mg by mouth every evening.       Followup:   Follow-up Information    Jed Limerick, NP On 07/23/2020.   Specialty: Urology Why: 10am, Catheter removal Contact information: [address] [place]  [address] [phone]

## 2020-07-19 NOTE — NC FL2 (Signed)
[place] MEDICAID FL2 LEVEL OF CARE SCREENING TOOL     IDENTIFICATION  Patient Name: Wesley [name] Birthdate: 05-Dec-1960 Sex: male Admission Date (Current Location): 07/18/2020  [name] Number:  Wesley [name] [id number] Facility and Address:  [facility],  [address], [place] [address]      Provider Number:    Attending Physician Name and Address:  Crist Fat, MD  Relative Name and Phone Number:  [name] dtr [phone]    Current Level of Care: Hospital Recommended Level of Care: Assisted Living Facility Prior Approval Number:    Date Approved/Denied:   PASRR Number:    Discharge Plan: Other (Comment) (ALF-[facility])    Current Diagnoses: Patient Active Problem List   Diagnosis Date Noted  . Bladder outlet obstruction 07/18/2020  . Subdural hematoma (HCC) 04/26/2018  . Foot drop, left 08/21/2014  . 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 Abnormality of gait 04/18/2014    Orientation RESPIRATION BLADDER Height & Weight     Self, Time, Situation, Place  Normal Indwelling catheter Weight: 111.6 kg Height:  5\' 11"  (180.3 cm)  BEHAVIORAL SYMPTOMS/MOOD NEUROLOGICAL BOWEL NUTRITION STATUS      Continent Diet  AMBULATORY STATUS COMMUNICATION OF NEEDS Skin   Total Care (w/c bound) Verbally Normal                       Personal Care Assistance Level of Assistance  Bathing, Feeding, Dressing Bathing Assistance: Maximum assistance Feeding assistance: Independent Dressing Assistance: Maximum assistance     Functional Limitations Info  Sight, Speech, Hearing Sight Info: Adequate Hearing Info: Adequate Speech Info: Adequate    SPECIAL CARE FACTORS FREQUENCY                       Contractures Contractures Info: Not present    Additional Factors Info  Code Status, Allergies Code Status Info:  (full code) Allergies Info:  (NKA)            Current Medications (07/19/2020):  This is the current hospital active medication list Current Facility-Administered Medications  Medication Dose Route Frequency Provider Last Rate Last Admin  . 0.45 % sodium chloride infusion   Intravenous Continuous 13/12/2019, MD 100 mL/hr at 07/19/20 0510 New Bag at 07/19/20 0510  . acetaminophen (TYLENOL) tablet 500 mg  500 mg Oral Q4H PRN 13/04/21, MD      . alum & mag hydroxide-simeth (MAALOX/MYLANTA) 200-200-20 MG/5ML suspension 30 mL  30 mL Oral PRN 03-24-2001, MD      . amLODipine (NORVASC) tablet 10 mg  10 mg Oral Daily Crist Fat, MD   10 mg at 07/19/20 0957  . baclofen (LIORESAL) tablet 20 mg  20 mg Oral TID 13/04/21, MD   20 mg at 07/19/20 0957  . Chlorhexidine Gluconate Cloth 2 % PADS 6 each  6 each Topical Daily 13/04/21, MD      . docusate sodium (COLACE) capsule 100 mg  100 mg Oral BID Crist Fat, MD   100 mg at 07/19/20 0957  . heparin injection 5,000 Units  5,000 Units Subcutaneous Q8H 13/04/21, MD   5,000 Units at 07/19/20 0603  . metoprolol succinate (TOPROL-XL) 24 hr tablet 50 mg  50 mg Oral Daily 13/04/21, MD   50 mg at 07/19/20 0957  . ondansetron (ZOFRAN)  injection 4 mg  4 mg Intravenous Q6H PRN Crist Fat, MD      . polyvinyl alcohol (LIQUIFILM TEARS) 1.4 % ophthalmic solution 1 drop  1 drop Both Eyes TID PRN Crist Fat, MD      . sodium chloride irrigation 0.9 % 3,000 mL  3,000 mL Irrigation Continuous Crist Fat, MD      . traMADol Janean Sark) tablet 50-100 mg  50-100 mg Oral Q6H PRN Crist Fat, MD      . traZODone (DESYREL) tablet 100 mg  100 mg Oral QHS Crist Fat, MD   100 mg at 07/18/20 2214  . zolpidem (AMBIEN) tablet 5 mg  5 mg Oral QHS Crist Fat, MD   5 mg at 07/18/20 2214     Discharge Medications: Please see discharge summary for a list of discharge medications.  Relevant  Imaging Results:  Relevant Lab Results:   Additional Information SS#[id number] [phone], Olegario Messier, RN

## 2020-07-19 NOTE — Discharge Instructions (Signed)

## 2020-07-19 NOTE — TOC Transition Note (Signed)
Transition of Care [facility]) - CM/SW Discharge Note   Patient Details  Name: ICARUS PARTCH MRN: [id number] Date of Birth: 04-25-1961  Transition of [facility]) CM/SW Contact:  Lanier Clam, RN Phone Number: 07/19/2020, 2:28 PM   Clinical Narrative:  D/c back to [facility] ALF-spoke to [facility] signed fl2-going to rm#102,nsg report tel#[phone].Per nsg-patient has already called [facility] yesterday for today. Patient is w/c bound-has w/c.No further CM needs.     Final next level of care: Assisted Living Barriers to Discharge: No Barriers Identified   Patient Goals and CMS Choice Patient states their goals for this hospitalization and ongoing recovery are:: return back to [facility]F CMS Medicare.gov Compare Post Acute Care list provided to:: Patient Choice offered to / list presented to : Patient  Discharge Placement                       Discharge Plan and Services   Discharge Planning Services: CM Consult                                 Social Determinants of Health (SDOH) Interventions     Readmission Risk Interventions No flowsheet data found.

## 2020-07-20 LAB — SURGICAL PATHOLOGY

## 2020-07-29 ENCOUNTER — Encounter (HOSPITAL_COMMUNITY): Payer: Self-pay

## 2020-07-29 ENCOUNTER — Other Ambulatory Visit: Payer: Self-pay

## 2020-07-29 ENCOUNTER — Emergency Department (HOSPITAL_COMMUNITY)
Admission: EM | Admit: 2020-07-29 | Discharge: 2020-07-29 | Disposition: A | Discharge status: Skilled Nursing Facility | Payer: Medicaid Other | Attending: Emergency Medicine | Admitting: Emergency Medicine

## 2020-07-29 DIAGNOSIS — N189 Chronic kidney disease, unspecified: Secondary | ICD-10-CM | POA: Diagnosis not present

## 2020-07-29 DIAGNOSIS — R319 Hematuria, unspecified: Secondary | ICD-10-CM | POA: Diagnosis present

## 2020-07-29 DIAGNOSIS — I129 Hypertensive chronic kidney disease with stage 1 through stage 4 chronic kidney disease, or unspecified chronic kidney disease: Secondary | ICD-10-CM | POA: Insufficient documentation

## 2020-07-29 DIAGNOSIS — Z79899 Other long term (current) drug therapy: Secondary | ICD-10-CM | POA: Diagnosis not present

## 2020-07-29 DIAGNOSIS — Z8673 Personal history of transient ischemic attack (TIA), and cerebral infarction without residual deficits: Secondary | ICD-10-CM | POA: Insufficient documentation

## 2020-07-29 DIAGNOSIS — R339 Retention of urine, unspecified: Secondary | ICD-10-CM | POA: Insufficient documentation

## 2020-07-29 LAB — BASIC METABOLIC PANEL
Anion gap: 5 (ref 5–15)
BUN: 13 mg/dL (ref 8–23)
CO2: 26 mmol/L (ref 22–32)
Calcium: 8.4 mg/dL — ABNORMAL LOW (ref 8.9–10.3)
Chloride: 96 mmol/L — ABNORMAL LOW (ref 98–111)
Creatinine, Ser: 1.1 mg/dL (ref 0.61–1.24)
GFR, Estimated: >60 mL/min (ref >60)
Glucose, Bld: 102 mg/dL — ABNORMAL HIGH (ref 70–99)
Potassium: 4 mmol/L (ref 3.5–5.1)
Sodium: 127 mmol/L — ABNORMAL LOW (ref 135–145)

## 2020-07-29 LAB — URINALYSIS, ROUTINE W REFLEX MICROSCOPIC
Bilirubin Urine: NEGATIVE
Glucose, UA: NEGATIVE mg/dL
Ketones, ur: NEGATIVE mg/dL
Nitrite: POSITIVE — AB
Protein, ur: 30 mg/dL — AB
Specific Gravity, Urine: 1.01 (ref 1.005–1.030)
pH: 6 (ref 5.0–8.0)

## 2020-07-29 LAB — CBC WITH DIFFERENTIAL/PLATELET
Abs Immature Granulocytes: 0.01 10*3/uL (ref 0.00–0.07)
Basophils Absolute: 0 10*3/uL (ref 0.0–0.1)
Basophils Relative: 1 %
Eosinophils Absolute: 0.1 10*3/uL (ref 0.0–0.5)
Eosinophils Relative: 1 %
HCT: 39.3 % (ref 39.0–52.0)
Hemoglobin: 13.1 g/dL (ref 13.0–17.0)
Immature Granulocytes: 0 %
Lymphocytes Relative: 40 %
Lymphs Abs: 2 10*3/uL (ref 0.7–4.0)
MCH: 27.8 pg (ref 26.0–34.0)
MCHC: 33.3 g/dL (ref 30.0–36.0)
MCV: 83.4 fL (ref 80.0–100.0)
Monocytes Absolute: 0.5 10*3/uL (ref 0.1–1.0)
Monocytes Relative: 9 %
Neutro Abs: 2.4 10*3/uL (ref 1.7–7.7)
Neutrophils Relative %: 49 %
Platelets: 375 10*3/uL (ref 150–400)
RBC: 4.71 MIL/uL (ref 4.22–5.81)
RDW: 13.9 % (ref 11.5–15.5)
WBC: 5 10*3/uL (ref 4.0–10.5)
nRBC: 0 % (ref 0.0–0.2)

## 2020-07-29 LAB — URINALYSIS, MICROSCOPIC (REFLEX): RBC / HPF: >50 RBC/hpf (ref 0–5)

## 2020-07-29 MED ORDER — CEPHALEXIN 500 MG PO CAPS: 500.0000 mg | ORAL_CAPSULE | Freq: Three times a day (TID) | ORAL | 0 refills | Status: DC

## 2020-07-29 MED ORDER — CEPHALEXIN 500 MG PO CAPS: 500.0000 mg | ORAL_CAPSULE | Freq: Three times a day (TID) | ORAL | 0 refills | Status: AC

## 2020-07-29 NOTE — ED Notes (Signed)
Recollect of cbc and bnp sent down to lab. Pt states Iv is hurting and requests that it be removed. This nurse assessed Iv it is no longer flushing or pulling back therefore, IV removed

## 2020-07-29 NOTE — ED Notes (Signed)
PTAR called for Pt transfer back to facility

## 2020-07-29 NOTE — ED Notes (Signed)
Foley placed without issue.

## 2020-07-29 NOTE — ED Provider Notes (Signed)
[facility]-EMERGENCY DEPT Provider Note   CSN: [id number] Arrival date & time: 07/29/20  1223     History Chief Complaint  Patient presents with  . Hematuria    Wesley [name] is a 59 y.o. male.  Presents to ER with concern for hematuria.  Patient reports he had enlarged prostate, underwent TURP, reports no complications with procedure.  Has been doing fine.  Urinating regularly.  Then today he noted some blood in urine.  No clots.  No pain.  No fever.  No dysuria.  HPI     Past Medical History:  Diagnosis Date  . Abnormality of gait 04/18/2014  . BPH (benign prostatic hyperplasia)   . Brainstem stroke (HCC) 2010   , left side weak  . Chronic renal insufficiency   . CKD (chronic kidney disease)   . Dyslipidemia   . Foot drop, left 08/21/2014  . Gait disorder   . 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 Hypertension   . Seizures (HCC)   . Ventricular shunt in place 2010    Patient Active Problem List   Diagnosis Date Noted  . Bladder outlet obstruction 07/18/2020  . Subdural hematoma (HCC) 04/26/2018  . Foot drop, left 08/21/2014  . 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 Abnormality of gait 04/18/2014    Past Surgical History:  Procedure Laterality Date  . athroscopic surgery knee Left   . CAROTID ENDARTERECTOMY    . TRANSURETHRAL RESECTION OF PROSTATE N/A 07/18/2020   Procedure: TRANSURETHRAL RESECTION OF THE PROSTATE (TURP);  Surgeon: Crist Fat, MD;  Location: [facility] ORS;  Service: Urology;  Laterality: N/A;  . umilical hernia repair         Family History  Problem Relation Age of Onset  . Diabetes Mother        died of dm complication  . Brain cancer Father        brain tumor  . Alcoholism Sister     Social History   Tobacco Use  . Smoking status: Never Smoker  . Smokeless tobacco: Never Used  Vaping Use  .  Vaping Use: Never used  Substance Use Topics  . Alcohol use: No  . Drug use: No    Home Medications Prior to Admission medications   Medication Sig Start Date End Date Taking? Authorizing Provider  acetaminophen (TYLENOL) 500 MG tablet Take 500 mg by mouth every 4 (four) hours as needed for mild pain, fever or headache (Do not exceed 2000 mg).     [provider]  alum & mag hydroxide-simeth (MAALOX/MYLANTA) 200-200-20 MG/5ML suspension Take 30 mLs by mouth as needed for indigestion (do not exceed 4 doses in 24 hours).     [provider]  amLODipine (NORVASC) 10 MG tablet Take 10 mg by mouth daily.    [provider]  baclofen (LIORESAL) 20 MG tablet Take 1 tablet (20 mg total) by mouth 3 (three) times daily. 02/20/15   York Spaniel, MD  cephALEXin (KEFLEX) 500 MG capsule Take 1 capsule (500 mg total) by mouth 3 (three) times daily for 7 days. 07/29/20 08/05/20  Milagros Loll, MD  guaifenesin (ROBITUSSIN) 100 MG/5ML syrup Take 200 mg by mouth every 6 (six) hours as needed for cough (not to exceed 4 doses in 24 hours).     [provider]  loperamide (IMODIUM) 2 MG capsule Take 2 mg by mouth as  needed for diarrhea or loose stools (do not exceed 8 doses 24 hours).     [provider]  magnesium citrate SOLN Take 1 Bottle by mouth daily as needed for severe constipation.     [provider]  magnesium hydroxide (MILK OF MAGNESIA) 400 MG/5ML suspension Take 30 mLs by mouth at bedtime as needed for mild constipation.     [provider]  metoprolol succinate (TOPROL-XL) 50 MG 24 hr tablet Take 50 mg by mouth daily. Take with or immediately following a meal.    [provider]  metoprolol tartrate (LOPRESSOR) 100 MG tablet Take 0.5 tablets (50 mg total) by mouth 2 (two) times daily. Hold lopressor if sbp less than 90. Patient not taking: Reported on 04/05/2020 04/27/18   Albertine Grates, MD  Multiple Vitamin (MULTIVITAMIN) tablet  Take 1 tablet by mouth daily.    [provider]  Neomycin-Bacitracin-Polymyxin (TRIPLE ANTIBIOTIC) [phone] OINT Apply 1 application topically as needed (skin tears).     [provider]  Polyethyl Glycol-Propyl Glycol (SYSTANE ULTRA) 0.4-0.3 % SOLN Place 1 drop into both eyes 3 (three) times daily as needed (dry eyes).    [provider]  polyvinyl alcohol (LIQUIFILM TEARS) 1.4 % ophthalmic solution Place 1 drop into both eyes 3 (three) times daily as needed for dry eyes. Patient not taking: Reported on 07/04/2020    [provider]  traZODone (DESYREL) 100 MG tablet Take 100 mg by mouth at bedtime.     [provider]  zolpidem (AMBIEN) 5 MG tablet Take 5 mg by mouth every evening.     [provider]    Allergies    Patient has no known allergies.  Review of Systems   Review of Systems  Constitutional: Negative for chills and fever.  HENT: Negative for ear pain and sore throat.   Eyes: Negative for pain and visual disturbance.  Respiratory: Negative for cough and shortness of breath.   Cardiovascular: Negative for chest pain and palpitations.  Gastrointestinal: Negative for abdominal pain, nausea and vomiting.  Genitourinary: Positive for hematuria. Negative for dysuria.  Musculoskeletal: Negative for arthralgias and back pain.  Skin: Negative for color change and rash.  Neurological: Negative for seizures and syncope.  All other systems reviewed and are negative.   Physical Exam Updated Vital Signs BP 113/87   Pulse 70   Temp 98.6 F (37 C) (Oral)   Resp 18   Ht 5\' 11"  (1.803 m)   Wt 117.9 kg   SpO2 99%   BMI 36.26 kg/m   Physical Exam Vitals and nursing note reviewed.  Constitutional:      Appearance: He is well-developed.  HENT:     Head: Normocephalic and atraumatic.  Eyes:     Conjunctiva/sclera: Conjunctivae normal.  Cardiovascular:     Rate and Rhythm: Normal rate and regular rhythm.     Heart  sounds: No murmur heard.   Pulmonary:     Effort: Pulmonary effort is normal. No respiratory distress.     Breath sounds: Normal breath sounds.  Abdominal:     Palpations: Abdomen is soft.     Tenderness: There is no abdominal tenderness.  Musculoskeletal:        General: No deformity or signs of injury.     Cervical back: Neck supple.  Skin:    General: Skin is warm and dry.     Capillary Refill: Capillary refill takes less than 2 seconds.  Neurological:     General:  No focal deficit present.     Mental Status: He is alert and oriented to person, place, and time.  Psychiatric:        Mood and Affect: Mood normal.     ED Results / Procedures / Treatments   Labs (all labs ordered are listed, but only abnormal results are displayed) Labs Reviewed  URINALYSIS, ROUTINE W REFLEX MICROSCOPIC - Abnormal; Notable for the following components:      Result Value   Color, Urine RED (*)    Hgb urine dipstick LARGE (*)    Protein, ur 30 (*)    Nitrite POSITIVE (*)    Leukocytes,Ua MODERATE (*)    All other components within normal limits  BASIC METABOLIC PANEL - Abnormal; Notable for the following components:   Sodium 127 (*)    Chloride 96 (*)    Glucose, Bld 102 (*)    Calcium 8.4 (*)    All other components within normal limits  URINALYSIS, MICROSCOPIC (REFLEX) - Abnormal; Notable for the following components:   Bacteria, UA RARE (*)    All other components within normal limits  URINE CULTURE  CBC WITH DIFFERENTIAL/PLATELET    EKG None  Radiology No results found.  Procedures Procedures (including critical care time)  Medications Ordered in ED Medications - No data to display  ED Course  I have reviewed the triage vital signs and the nursing notes.  Pertinent labs & imaging results that were available during my care of the patient were reviewed by me and considered in my medical decision making (see chart for details).    MDM Rules/Calculators/A&P                          59 year old male with transurethral resection of prostate on 11/3 presents to ER with hematuria.  On exam well-appearing, no distress.  He denied any other symptoms.  Postvoid residual 410 mL, concern for retention.  Will place Foley catheter, check basic labs.  UA with possible infection, will start on antibiotics.  Recommend patient have close follow-up with his urologist on Monday.  Instructed to call office Monday morning.   After the discussed management above, the patient was determined to be safe for discharge.  The patient was in agreement with this plan and all questions regarding their care were answered.  ED return precautions were discussed and the patient will return to the ED with any significant worsening of condition.   Final Clinical Impression(s) / ED Diagnoses Final diagnoses:  Hematuria, unspecified type  Urinary retention    Rx / DC Orders ED Discharge Orders         Ordered    cephALEXin (KEFLEX) 500 MG capsule  3 times daily,   Status:  Discontinued        07/29/20 1656    cephALEXin (KEFLEX) 500 MG capsule  3 times daily        07/29/20 1656           Milagros Loll, MD 07/29/20 2227

## 2020-07-29 NOTE — ED Triage Notes (Signed)
Surgery on 07/18/20 for prostate due to urinary retention. Patient states catheter prior to surgery. No foley placed after surgery. Hematuria started today. Pt denies pain and has normal flow. No difficulty urinating.

## 2020-07-29 NOTE — ED Notes (Signed)
Attempted to call [facility] for pt report prior to transfer home. No answer at this time. PTAR at pt bedside for transfer

## 2020-07-29 NOTE — ED Notes (Addendum)
Bladder scan, 410 post void. MD made aware.

## 2020-07-29 NOTE — ED Notes (Signed)
Leg bag placed and pt assisted with getting dressed

## 2020-07-29 NOTE — Discharge Instructions (Signed)
Please follow-up with your urologist, ideally to be seen early this week.  Continue Foley catheter until further instructed by urologist.  Take antibiotic for possible infection.  Please also follow-up with your primary doctor, your sodium level was mildly low and should be monitored closely.

## 2020-07-31 LAB — URINE CULTURE: Culture: NO GROWTH

## 2022-08-04 ENCOUNTER — Ambulatory Visit: Payer: Medicaid Other | Admitting: Sports Medicine

## 2022-08-13 ENCOUNTER — Encounter: Payer: Self-pay | Admitting: Sports Medicine

## 2022-08-13 ENCOUNTER — Ambulatory Visit (INDEPENDENT_AMBULATORY_CARE_PROVIDER_SITE_OTHER): Payer: Medicaid Other

## 2022-08-13 ENCOUNTER — Ambulatory Visit (INDEPENDENT_AMBULATORY_CARE_PROVIDER_SITE_OTHER): Payer: Medicaid Other | Admitting: Sports Medicine

## 2022-08-13 DIAGNOSIS — M25531 Pain in right wrist: Secondary | ICD-10-CM

## 2022-08-13 DIAGNOSIS — I69354 Hemiplegia and hemiparesis following cerebral infarction affecting left non-dominant side: Secondary | ICD-10-CM

## 2022-08-13 DIAGNOSIS — M25611 Stiffness of right shoulder, not elsewhere classified: Secondary | ICD-10-CM | POA: Diagnosis not present

## 2022-08-13 DIAGNOSIS — R29898 Other symptoms and signs involving the musculoskeletal system: Secondary | ICD-10-CM | POA: Diagnosis not present

## 2022-08-13 NOTE — Progress Notes (Signed)
xHere for injection into his right wrist History of neuropathy  Wheelchair bound, no use of left arm No history of xrays taken for this  He has limited motion in that hand, and its hard to grip with that hand

## 2022-08-13 NOTE — Progress Notes (Signed)
Wesley [name] - 61 y.o. male MRN [id number]  Date of birth: [birth year]  Office Visit Note: Visit Date: 08/13/2022 PCP: Merrilyn Puma, DO Referred by: Merrilyn Puma, DO  Subjective: Chief Complaint  Patient presents with   Right Hand - Pain   HPI: Wesley [name] is a pleasant 61 y.o. male who presents today for right upper extremity and wrist weakness.  Wesley [name] does have a history of a CVA in 2010 which left him with rather extensive left-sided hemiparesis.  He is in a motorized wheelchair today.  He does have use of his right arm and leg, however over the last 2 months or so he has noticed some restriction in range of motion and weakness of the right arm and the wrist.  He used to live in assisted living and is now in a skilled nursing facility.  He would like to maintain his strength and function with feeding himself and relies on his right hand to do this.  He denies any specific pain in the wrist or the upper extremity.  He does report diminished range of motion as well as decreased grip strength.  He denies any pain coming from the neck.  Occasionally he will get some neuropathy on the right side of his body, he is on gabapentin daily for this.  He is inquiring about a steroid injection today.  Pertinent ROS were reviewed with the patient and found to be negative unless otherwise specified above in HPI.   Assessment & Plan: Visit Diagnoses:  1. Right arm weakness   2. Pain in right wrist   3. Hemiparesis affecting left side as late effect of cerebrovascular accident (HCC)   4. Shoulder stiffness, right    Plan: Discussed with Onalee Hua all his treatment options.  He is not having any pain and his x-rays do not show any arthritis, so I discussed with him that I do not think the steroid injection would benefit him at this time.  He does have some weakness as well as some stiffness of the shoulder and the entire right upper extremity.  I think the best thing for him would be to get  him into occupational therapy to work on improving his active range of motion about the shoulder as well as grip strength and wrist exercises to preserve his function.  He does have some neuropathy but is managed on gabapentin, his numbness and tingling is intermittent and as not as bothersome as his dysfunction that he would like to maintain.  We will let him work with occupational therapy and he will follow-up with me as needed.  Follow-up: Return for Make an appointment to see Earnest Rosier for occupational therapy for RUE stiffness and dysfunction.   Meds & Orders: No orders of the defined types were placed in this encounter.   Orders Placed This Encounter  Procedures   XR Wrist 2 Views Right     Procedures: No procedures performed      Clinical History: No specialty comments available.  He reports that he has never smoked. He has never used smokeless tobacco. No results for input(s): "HGBA1C", "LABURIC" in the last 8760 hours.  Objective:   Vital Signs: There were no vitals taken for this visit.  Physical Exam  Gen: Well-appearing, in no acute distress; non-toxic CV: Regular Rate. Well-perfused. Warm.  Resp: Breathing unlabored on room air; no wheezing. Psych: Fluid speech in conversation; appropriate affect; normal thought process Neuro: Sensation intact throughout. No gross coordination deficits.  Ortho Exam - Seated in motorized wheelchair. No use of LUE or LLE.  In terms of his right upper extremity I cannot see any swelling or bony deformity.  He does have stiffness in the right shoulder and loss of active range of motion: Forward flexion to 90 degrees, abduction to 70 degrees and limited internal/external rotation.  I am able to passively take him to 140 degrees of flexion, 120 degrees of abduction as well as 90 degrees of external rotation and 60 degrees of internal rotation.  Strength of the right upper extremity is 3/5 at the level of the shoulder and the wrist.  Sensation  to light touch intact.  Imaging: XR Wrist 2 Views Right  Result Date: 08/13/2022 2 views of the right wrist including AP and lateral femoral ordered and reviewed by myself.  X-rays demonstrate proper alignment of the carpal bones.  Neutral ulnar variance.  There is no acute fracture or significant osteoarthritis throughout the wrist or metacarpal joints.   Past Medical/Family/Surgical/Social History: Medications & Allergies reviewed per EMR, new medications updated. Patient Active Problem List   Diagnosis Date Noted   Bladder outlet obstruction 07/18/2020   Subdural hematoma (HCC) 04/26/2018   Foot drop, left 08/21/2014   Hemiparesis affecting left side as late effect of cerebrovascular accident (HCC) 04/18/2014   Abnormality of gait 04/18/2014   Past Medical History:  Diagnosis Date   Abnormality of gait 04/18/2014   BPH (benign prostatic hyperplasia)    Brainstem stroke (HCC) 2010   , left side weak   Chronic renal insufficiency    CKD (chronic kidney disease)    Dyslipidemia    Foot drop, left 08/21/2014   Gait disorder    Hemiparesis affecting left side as late effect of cerebrovascular accident (HCC) 04/18/2014   Hypertension    Seizures (HCC)    Ventricular shunt in place 2010   Family History  Problem Relation Age of Onset   Diabetes Mother        died of dm complication   Brain cancer Father        brain tumor   Alcoholism Sister    Past Surgical History:  Procedure Laterality Date   athroscopic surgery knee Left    CAROTID ENDARTERECTOMY     TRANSURETHRAL RESECTION OF PROSTATE N/A 07/18/2020   Procedure: TRANSURETHRAL RESECTION OF THE PROSTATE (TURP);  Surgeon: Crist Fat, MD;  Location: [facility] ORS;  Service: Urology;  Laterality: N/A;   umilical hernia repair     Social History   Occupational History   Occupation: disability  Tobacco Use   Smoking status: Never   Smokeless tobacco: Never  Vaping Use   Vaping Use: Never used  Substance and Sexual  Activity   Alcohol use: No   Drug use: No   Sexual activity: Not on file   Addendum:  Originally wanted him to see our occupational therapist, Earnest Rosier, however since his skilled nursing facility has PT/OT he will follow-up there to work on strengthening and improving range of motion.

## 2023-01-07 ENCOUNTER — Ambulatory Visit (INDEPENDENT_AMBULATORY_CARE_PROVIDER_SITE_OTHER): Payer: Medicaid Other | Admitting: Diagnostic Neuroimaging

## 2023-01-07 ENCOUNTER — Telehealth: Payer: Self-pay | Admitting: Diagnostic Neuroimaging

## 2023-01-07 ENCOUNTER — Encounter: Payer: Self-pay | Admitting: Diagnostic Neuroimaging

## 2023-01-07 VITALS — BP 92/60 | HR 52 | Ht 71.0 in | Wt 250.0 lb

## 2023-01-07 DIAGNOSIS — G8191 Hemiplegia, unspecified affecting right dominant side: Secondary | ICD-10-CM

## 2023-01-07 NOTE — Progress Notes (Signed)
[facility]  PATIENT: Wesley [name] DOB: [birth year]  REFERRING CLINICIAN: Johnnye Sima, FNP HISTORY FROM: patient REASON FOR VISIT: new consult   HISTORICAL  CHIEF COMPLAINT:  Chief Complaint  Patient presents with   New Patient (Initial Visit)    Patient in room 6 with his caregiver. Patient states his right hand and arm are feeling Constricted.    HISTORY OF PRESENT ILLNESS:   62 year old male here for evaluation of right sided weakness.  Symptoms started around 6 months ago with right upper and right lower extremity stiffness, increased tone, weakness and difficulty with coordination.  Has history of stroke with left hemiplegia.  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  Also had trace left frontal subdural hemorrhage in 2019.   REVIEW OF SYSTEMS: Full 14 system review of systems performed and negative with exception of: as per HPI.  ALLERGIES: No Known Allergies  HOME MEDICATIONS: Outpatient Medications Prior to Visit  Medication Sig Dispense Refill   acetaminophen (TYLENOL) 500 MG tablet Take 500 mg by mouth every 4 (four) hours as needed for mild pain, fever or headache (Do not exceed 2000 mg).      alum & mag hydroxide-simeth (MAALOX/MYLANTA) 200-200-20 MG/5ML suspension Take 30 mLs by mouth as needed for indigestion (do not exceed 4 doses in 24 hours).      amLODipine (NORVASC) 10 MG tablet Take 10 mg by mouth daily.     baclofen (LIORESAL) 20 MG tablet Take 1 tablet (20 mg total) by mouth 3 (three) times daily. 90 each 5   guaifenesin (ROBITUSSIN) 100 MG/5ML syrup Take 200 mg by mouth every 6 (six) hours as needed for cough (not to exceed 4 doses in 24 hours).      loperamide (IMODIUM) 2 MG capsule Take 2 mg by mouth as needed for diarrhea or loose stools (do not exceed 8 doses 24 hours).      magnesium citrate SOLN Take 1 Bottle by mouth daily as needed for severe constipation.      magnesium hydroxide (MILK OF MAGNESIA) 400  MG/5ML suspension Take 30 mLs by mouth at bedtime as needed for mild constipation.      metoprolol succinate (TOPROL-XL) 50 MG 24 hr tablet Take 50 mg by mouth daily. Take with or immediately following a meal.     Multiple Vitamin (MULTIVITAMIN) tablet Take 1 tablet by mouth daily.     Neomycin-Bacitracin-Polymyxin (TRIPLE ANTIBIOTIC) [phone] OINT Apply 1 application topically as needed (skin tears).      Polyethyl Glycol-Propyl Glycol (SYSTANE ULTRA) 0.4-0.3 % SOLN Place 1 drop into both eyes 3 (three) times daily as needed (dry eyes).     traZODone (DESYREL) 100 MG tablet Take 100 mg by mouth at bedtime.      zolpidem (AMBIEN) 5 MG tablet Take 5 mg by mouth every evening.      No facility-administered medications prior to visit.    PAST MEDICAL HISTORY: Past Medical History:  Diagnosis Date   Abnormality of gait 04/18/2014   BPH (benign prostatic hyperplasia)    Brainstem stroke 2010   , left side weak   Chronic renal insufficiency    CKD (chronic kidney disease)    Dyslipidemia    Foot drop, left 08/21/2014   Gait disorder    Hemiparesis affecting left side as late effect of cerebrovascular accident 04/18/2014   Hypertension    Seizures    Ventricular shunt in place 2010    PAST SURGICAL HISTORY:  Past Surgical History:  Procedure Laterality Date   athroscopic surgery knee Left    CAROTID ENDARTERECTOMY     TRANSURETHRAL RESECTION OF PROSTATE N/A 07/18/2020   Procedure: TRANSURETHRAL RESECTION OF THE PROSTATE (TURP);  Surgeon: Crist Fat, MD;  Location: [facility];  Service: Urology;  Laterality: N/A;   umilical hernia repair      FAMILY HISTORY: Family History  Problem Relation Age of Onset   Diabetes Mother        died of dm complication   Brain cancer Father        brain tumor   Alcoholism Sister     SOCIAL HISTORY: Social History   Socioeconomic History   Marital status: Single    Spouse name: Not on file   Number of children: 2   Years of education:  9th   Highest education level: Not on file  Occupational History   Occupation: disability  Tobacco Use   Smoking status: Never   Smokeless tobacco: Never  Vaping Use   Vaping Use: Never used  Substance and Sexual Activity   Alcohol use: No   Drug use: No   Sexual activity: Not on file  Other Topics Concern   Not on file  Social History Narrative   Patient drinks about 2 cups of coffee daily.   Patient is right handed.   Resides at [facility] [phone]   Social Determinants of Health   Financial Resource Strain: Not on file  Food Insecurity: Not on file  Transportation Needs: Not on file  Physical Activity: Not on file  Stress: Not on file  Social Connections: Not on file  Intimate Partner Violence: Not on file     PHYSICAL EXAM  GENERAL EXAM/CONSTITUTIONAL: Vitals:  Vitals:   01/07/23 1038  BP: 92/60  Pulse: (!) 52  Weight: 250 lb (113.4 kg)  Height:  (1.803 m)   Body mass index is 34.87 kg/m. Wt Readings from Last 3 Encounters:  01/07/23 250 lb (113.4 kg)  07/29/20 260 lb (117.9 kg)  07/18/20 246 lb 1.6 oz (111.6 kg)   Patient is in no distress; well developed, nourished and groomed; neck is supple  CARDIOVASCULAR: Examination of carotid arteries is normal; no carotid bruits Regular rate and rhythm, no murmurs Examination of peripheral vascular system by observation and palpation is normal  EYES: Ophthalmoscopic exam of optic discs and posterior segments is normal; no papilledema or hemorrhages No results found.  MUSCULOSKELETAL: Gait, strength, tone, movements noted in Neurologic exam below  NEUROLOGIC: MENTAL STATUS:      No data to display         awake, alert, oriented to person, place and time recent and remote memory intact normal attention and concentration language fluent, comprehension intact, naming intact fund of knowledge appropriate  CRANIAL NERVE:  2nd - no papilledema on fundoscopic exam 2nd, 3rd, 4th, 6th -  pupils equal and reactive to light, visual fields full to confrontation, extraocular muscles intact, no nystagmus 5th - facial sensation symmetric 7th - facial strength --> DECR RIGHT NL FOLD 8th - hearing intact 9th - palate elevates symmetrically, uvula midline 11th - shoulder shrug symmetric 12th - tongue protrusion midline MILD DYSARTHRIA  MOTOR:  LUE, LLE --> DENSE LEFT HEMIPLEGIA; INCREASED TONE RUE 2-3 WITH INCREASED TONE RLE 2-3 WITH INCREASED TONE  SENSORY:  normal and symmetric to light touch, pinprick, temperature, vibration  COORDINATION:  finger-nose-finger, fine finger movements LIMITED BY WEAKNESS  REFLEXES:  deep tendon reflexes --> RUE,  RLE 2; LUE / LLE 1+  GAIT/STATION:  IN POWERCHAIR     DIAGNOSTIC DATA (LABS, IMAGING, TESTING) - I reviewed patient records, labs, notes, testing and imaging myself where available.  Lab Results  Component Value Date   WBC 5.0 07/29/2020   HGB 13.1 07/29/2020   HCT 39.3 07/29/2020   MCV 83.4 07/29/2020   PLT 375 07/29/2020      Component Value Date/Time   NA 127 (L) 07/29/2020 1351   K 4.0 07/29/2020 1351   CL 96 (L) 07/29/2020 1351   CO2 26 07/29/2020 1351   GLUCOSE 102 (H) 07/29/2020 1351   BUN 13 07/29/2020 1351   CREATININE 1.10 07/29/2020 1351   CALCIUM 8.4 (L) 07/29/2020 1351   PROT 7.7 07/09/2020 1147   ALBUMIN 4.1 07/09/2020 1147   AST 10 (L) 07/09/2020 1147   ALT 15 07/09/2020 1147   ALKPHOS 57 07/09/2020 1147   BILITOT 1.0 07/09/2020 1147   GFRNONAA >60 07/29/2020 1351   GFRAA >60 04/22/2020 1901   No results found for: "CHOL", "HDL", "LDLCALC", "LDLDIRECT", "TRIG", "CHOLHDL" No results found for: "HGBA1C" No results found for: "VITAMINB12" No results found for: "TSH"  04/27/18 CT head [I reviewed images myself and agree with interpretation. -VRP]  - Stable 5 mm left cerebral convexity subdural hematoma with 3 mm of left-to-right midline shift. No new acute intracranial abnormality. -  Subsequent submitted coronal reconstruction images demonstrates a trace volume of subarachnoid hemorrhage within the left sylvian fissure and within a sulcus of the left parietal lobe which is new from the prior study (series 7, image 46).   ASSESSMENT AND PLAN  62 y.o. year old male here with:   Dx:  1. 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    Suanne Marker, MD 01/07/2023, 11:02 AM Certified in Neurology, Neurophysiology and Neuroimaging  [facility] [address] [place], [place] [address] [phone]

## 2023-01-07 NOTE — Telephone Encounter (Signed)
[place] medicaid NPR sent to GI [phone] 

## 2023-01-23 ENCOUNTER — Ambulatory Visit (HOSPITAL_COMMUNITY)
Admission: RE | Admit: 2023-01-23 | Discharge: 2023-01-23 | Disposition: A | Discharge status: Home / Self Care | Payer: Medicaid Other | Source: Ambulatory Visit | Attending: Diagnostic Neuroimaging | Admitting: Diagnostic Neuroimaging

## 2023-01-23 DIAGNOSIS — G8191 Hemiplegia, unspecified affecting right dominant side: Secondary | ICD-10-CM | POA: Insufficient documentation

## 2023-01-29 ENCOUNTER — Telehealth: Payer: Self-pay | Admitting: Neurology

## 2023-01-29 NOTE — Telephone Encounter (Signed)
Received a call from GI with a call report for the pt, completed CT head. The report is in epic and I will forward to Dr Marjory Lies for review.

## 2023-01-29 NOTE — Telephone Encounter (Signed)
Called the patient's numbe on file and this went to the facility he is at. They referred me to the nursing station he is at. She advised the MD is not in and that I would need to fax the results/report to [phone] and atn to unit manager who will then provide to MD.   Per Dr Marjory Lies, Small left sided subdural fluid / hygroma now noted. Was slightly noted in 2019, but now larger. Recommend to repeat CT head in 1 week for comparison / monitoring. -VRP

## 2023-02-11 ENCOUNTER — Telehealth: Payer: Self-pay

## 2023-02-11 NOTE — Telephone Encounter (Signed)
Attempted to call patient at facility, on hold for a long time. Called brother on [place]. No answer. Left message to return call

## 2023-02-11 NOTE — Telephone Encounter (Signed)
-----   Message from Suanne Marker, MD sent at 02/06/2023  9:14 PM EDT ----- Mild left subdural hygroma noted. Recommend to repeat CT in 1 month to ensure stability. -VRP

## 2023-02-27 ENCOUNTER — Encounter (HOSPITAL_COMMUNITY): Payer: Self-pay | Admitting: Family Medicine

## 2023-03-09 ENCOUNTER — Other Ambulatory Visit (HOSPITAL_COMMUNITY): Payer: Self-pay | Admitting: Family Medicine

## 2023-03-09 DIAGNOSIS — M25511 Pain in right shoulder: Secondary | ICD-10-CM

## 2023-03-12 ENCOUNTER — Ambulatory Visit (HOSPITAL_COMMUNITY): Admission: RE | Admit: 2023-03-12 | Payer: Medicaid Other | Source: Ambulatory Visit

## 2023-03-17 ENCOUNTER — Encounter (HOSPITAL_COMMUNITY): Payer: Self-pay

## 2023-03-17 ENCOUNTER — Ambulatory Visit (HOSPITAL_COMMUNITY)
Admission: RE | Admit: 2023-03-17 | Discharge: 2023-03-17 | Disposition: A | Discharge status: Home / Self Care | Payer: Medicaid Other | Source: Ambulatory Visit | Attending: Family Medicine | Admitting: Family Medicine

## 2023-03-17 DIAGNOSIS — M25511 Pain in right shoulder: Secondary | ICD-10-CM

## 2023-06-08 ENCOUNTER — Telehealth: Payer: Self-pay | Admitting: Diagnostic Neuroimaging

## 2023-06-08 NOTE — Telephone Encounter (Signed)
Pt said, right arm constriction has worsened, can not feed myself, nurses at the facility have to feed me. Would like a call from the nurse for earliest appt.

## 2023-06-08 NOTE — Telephone Encounter (Signed)
Called the number on file which connected me to nursing facility. Advised I was calling the patient back. She asked if I needed to speak with nurse. Informed that the message that was left for me states that pt had called with concerns.  The nurse, Orion Crook, asked why he called [place]. I advised the message that was left and stated that I was just returning the call. The nurse states he is total care and has not been able to feed his self for some time now she states that they have primary MD's there that round and can discuss his concerns as well with the patient. I asked that they please relay to the patient that I attempted to call back and discuss with him.
